# Patient Record
Sex: Female | Born: 2000 | Race: White | Hispanic: No | Marital: Married | Smoking: Never smoker
Health system: Southern US, Community
[De-identification: ages and names within clinical notes are randomized; demographics above are authoritative.]

## PROBLEM LIST (undated history)

## (undated) DIAGNOSIS — E669 Obesity, unspecified: Secondary | ICD-10-CM

## (undated) DIAGNOSIS — F32A Depression, unspecified: Secondary | ICD-10-CM

## (undated) DIAGNOSIS — F419 Anxiety disorder, unspecified: Secondary | ICD-10-CM

## (undated) DIAGNOSIS — D649 Anemia, unspecified: Secondary | ICD-10-CM

## (undated) DIAGNOSIS — J45909 Unspecified asthma, uncomplicated: Secondary | ICD-10-CM

## (undated) DIAGNOSIS — K8020 Calculus of gallbladder without cholecystitis without obstruction: Secondary | ICD-10-CM

## (undated) HISTORY — PX: CHOLECYSTECTOMY: SHX55

## (undated) HISTORY — PX: NO PAST SURGERIES: SHX2092

## (undated) HISTORY — DX: Anemia, unspecified: D64.9

---

## 2021-03-05 ENCOUNTER — Other Ambulatory Visit: Payer: Self-pay

## 2021-03-05 ENCOUNTER — Encounter: Payer: Self-pay | Admitting: Nurse Practitioner

## 2021-03-05 ENCOUNTER — Ambulatory Visit (INDEPENDENT_AMBULATORY_CARE_PROVIDER_SITE_OTHER): Payer: Medicaid Other | Admitting: Nurse Practitioner

## 2021-03-05 VITALS — BP 131/68 | HR 87 | Temp 98.7°F | Ht 64.0 in | Wt 197.2 lb

## 2021-03-05 DIAGNOSIS — Z7689 Persons encountering health services in other specified circumstances: Secondary | ICD-10-CM | POA: Diagnosis not present

## 2021-03-05 DIAGNOSIS — Z3A38 38 weeks gestation of pregnancy: Secondary | ICD-10-CM | POA: Diagnosis not present

## 2021-03-05 NOTE — Progress Notes (Signed)
BP 131/68 (BP Location: Right Arm, Cuff Size: Normal)    Pulse 87    Temp 98.7 F (37.1 C) (Oral)    Ht 5\' 4"  (1.626 m)    Wt 197 lb 3.2 oz (89.4 kg)    SpO2 98%    BMI 33.85 kg/m    Subjective:    Patient ID: Stacey Mccarty, female    DOB: 2000-08-22, 21 y.o.   MRN: [id number]  HPI: Stacey Mccarty is a 21 y.o. female  Chief Complaint  Patient presents with   New Patient (Initial Visit)    Patient states she would like to discuss possible PT for her back. She states that she had issues with her lower back after her first pregnancy.    Patient presents to clinic to establish care with new PCP.  Introduced to Designer, jewellery role and practice setting.  All questions answered.  Discussed provider/patient relationship and expectations.  Patient reports a history of Depression and Anxiety.  Patient denies a history of: Hypertension, Elevated Cholesterol, Diabetes, Thyroid problems, Neurological problems, and Abdominal problems.    Patient is pregnant and due in with her second child in about 2 weeks. Patient states she changed insurances and her previous OB would no longer see her.  She plans to deliver and [facility].  She has had some anemia during her pregnancy.  At this point she has tried to get into other Obs but it hasn't been successful.  She plans to just show up when she is in labor.    Denies HA, CP, SOB, dizziness, palpitations, visual changes, and lower extremity swelling.   Active Ambulatory Problems    Diagnosis Date Noted   No Active Ambulatory Problems   Resolved Ambulatory Problems    Diagnosis Date Noted   No Resolved Ambulatory Problems   Past Medical History:  Diagnosis Date   Anemia    History reviewed. No pertinent surgical history. Family History  Problem Relation Age of Onset   Crohn's disease Mother    Schizophrenia Father    Epilepsy Sister    Autism Sister    Autism Maternal Grandfather      Review of Systems  Eyes:  Negative for  visual disturbance.  Respiratory:  Negative for cough, chest tightness and shortness of breath.   Cardiovascular:  Negative for chest pain, palpitations and leg swelling.  Neurological:  Negative for dizziness and headaches.   Per HPI unless specifically indicated above     Objective:    BP 131/68 (BP Location: Right Arm, Cuff Size: Normal)    Pulse 87    Temp 98.7 F (37.1 C) (Oral)    Ht 5\' 4"  (1.626 m)    Wt 197 lb 3.2 oz (89.4 kg)    SpO2 98%    BMI 33.85 kg/m   Wt Readings from Last 3 Encounters:  03/05/21 197 lb 3.2 oz (89.4 kg)    Physical Exam Vitals and nursing note reviewed.  Constitutional:      General: She is not in acute distress.    Appearance: Normal appearance. She is normal weight. She is not ill-appearing, toxic-appearing or diaphoretic.  HENT:     Head: Normocephalic.     Right Ear: External ear normal.     Left Ear: External ear normal.     Nose: Nose normal.     Mouth/Throat:     Mouth: Mucous membranes are moist.     Pharynx: Oropharynx is clear.  Eyes:  General:        Right eye: No discharge.        Left eye: No discharge.     Extraocular Movements: Extraocular movements intact.     Conjunctiva/sclera: Conjunctivae normal.     Pupils: Pupils are equal, round, and reactive to light.  Cardiovascular:     Rate and Rhythm: Normal rate and regular rhythm.     Heart sounds: No murmur heard. Pulmonary:     Effort: Pulmonary effort is normal. No respiratory distress.     Breath sounds: Normal breath sounds. No wheezing or rales.  Musculoskeletal:     Cervical back: Normal range of motion and neck supple.  Skin:    General: Skin is warm and dry.     Capillary Refill: Capillary refill takes less than 2 seconds.  Neurological:     General: No focal deficit present.     Mental Status: She is alert and oriented to person, place, and time. Mental status is at baseline.  Psychiatric:        Mood and Affect: Mood normal.        Behavior: Behavior  normal.        Thought Content: Thought content normal.        Judgment: Judgment normal.    No results found for this or any previous visit.    Assessment & Plan:   Problem List Items Addressed This Visit   None Visit Diagnoses     [redacted] weeks gestation of pregnancy    -  Primary   Urgent referral placed for patient to try and establish with a new OB. Discussed that if she does go into labor she should go to [facility] for delivery   Relevant Orders   Ambulatory referral to Obstetrics / Gynecology   Encounter to establish care       Follow up in 3 months for physical and fasting labs.        Follow up plan: Return in about 3 months (around 06/02/2021) for Physical and Fasting labs.

## 2021-03-06 ENCOUNTER — Telehealth: Payer: Self-pay | Admitting: Nurse Practitioner

## 2021-03-06 NOTE — Telephone Encounter (Signed)
[facility] states the referral to them states the pt plans to deliver at [facility].  She said their providers only deliver at [facility]. ?

## 2021-03-06 NOTE — Telephone Encounter (Signed)
Copied from CRM [id number]. Topic: General - Other ?>> Mar 06, 2021  3:17 PM Jaquita Rector A wrote: ?Reason for CRM: Patient called in to ask Larae Grooms if there are any OBGYN she can be referred to that is associated with [facility]. Say the one for [facility] does not work for her. Asking for a call back please at Ph# [phone] ?

## 2021-03-06 NOTE — Telephone Encounter (Signed)
Patient was notified that a referral was placed and our referral coordinator Brayton Caves would be reaching out to her. Patient verbalized understanding and has no further questions.  ?

## 2021-03-28 ENCOUNTER — Telehealth: Payer: Self-pay | Admitting: Family Medicine

## 2021-03-28 NOTE — Telephone Encounter (Signed)
Pt. States that she would like to schedule an appointment for a two week postpartum. She isn't one of our patients. She states that the clinic she was going to stopped taking her insurance, but she was a high risk pt. She delivered on 03/23/2021. Please call.  ?

## 2021-03-29 NOTE — Telephone Encounter (Signed)
Phone call to pt. Pt desires to come to [facility] for postpartum, had baby 03/23/21.  Pt states she had some problems with anemia and high blood pressure during pregnancy and both are resolved.  ?Pt prefers Monday appt. 05/06/21 schedule not open at this time.  Pt states she will call in April for 05/06/21 appt. ?

## 2021-03-29 NOTE — Telephone Encounter (Signed)
Call to client to discuss scheduling her a post-partum appt and connection lost. Repeat call to client and left message requesting she return call regarding scheduling her appt Number to call provided. Rich Number, RN ? ?

## 2021-05-02 ENCOUNTER — Ambulatory Visit
Admission: RE | Admit: 2021-05-02 | Discharge: 2021-05-02 | Disposition: A | Discharge status: Home / Self Care | Payer: Medicaid Other | Source: Ambulatory Visit | Attending: Internal Medicine | Admitting: Internal Medicine

## 2021-05-02 ENCOUNTER — Encounter: Payer: Self-pay | Admitting: Internal Medicine

## 2021-05-02 ENCOUNTER — Ambulatory Visit: Payer: Medicaid Other | Admitting: Internal Medicine

## 2021-05-02 ENCOUNTER — Ambulatory Visit
Admission: RE | Admit: 2021-05-02 | Discharge: 2021-05-02 | Disposition: A | Discharge status: Home / Self Care | Payer: Medicaid Other | Attending: Internal Medicine | Admitting: Internal Medicine

## 2021-05-02 VITALS — BP 126/73 | HR 82 | Temp 98.7°F | Wt 185.2 lb

## 2021-05-02 DIAGNOSIS — M79672 Pain in left foot: Secondary | ICD-10-CM | POA: Diagnosis not present

## 2021-05-02 MED ORDER — DICLOFENAC SODIUM 1 % EX GEL: 2.0000 g | Freq: Four times a day (QID) | CUTANEOUS | 1 refills | Status: DC

## 2021-05-02 NOTE — Progress Notes (Signed)
FINDINGS: ?There is no evidence of fracture or dislocation. There is no ?evidence of arthropathy or other focal bone abnormality. Soft ?tissues are unremarkable. ?? ?IMPRESSION: ?Negative. ? ?D/w pt she can use hot and cold compresses for her foot pain and resume her ADL's ?To use voltaren gel sec to breastfeeding  ? ?Frutoso Dimare ?

## 2021-05-02 NOTE — Progress Notes (Signed)
? ?BP 126/73   Pulse 82   Temp 98.7 ?F (37.1 ?C) (Oral)   Wt 185 lb 3.2 oz (84 kg)   LMP  (LMP Unknown) Comment: recent birth  SpO2 98%   Breastfeeding Unknown   BMI 31.79 kg/m?   ? ?Subjective:  ? ? Patient ID: Stacey Mccarty, female    DOB: [birth year], 20 y.o.   MRN: [id number] ? ?Chief Complaint  ?Patient presents with  ?? Toe Pain  ?  Pt states she has been having L big toe pain for the past week. States the pain is worsening. States she fell down the stairs recently and also had a chair turn over and hit the toe as well.   ?? Referral  ?  Pt states she need a referral to a psychologist to have a psych eval for her new job   ? ? ?HPI: ?Stacey Mccarty is a 21 y.o. female ? ?Toe Pain  ?The incident occurred 5 to 7 days ago Larey Seat off the stairs on monday and then on tuesday has had a chair fall on this, she went to the zoo @ [place]). The incident occurred at home. The pain is present in the left foot. The quality of the pain is described as stabbing. The pain is at a severity of 6/10. The pain is moderate. The pain has been Improving since onset. Associated symptoms include an inability to bear weight. Pertinent negatives include no loss of motion, loss of sensation, muscle weakness, numbness or tingling.  ? ? ?Chief Complaint  ?Patient presents with  ?? Toe Pain  ?  Pt states she has been having L big toe pain for the past week. States the pain is worsening. States she fell down the stairs recently and also had a chair turn over and hit the toe as well.   ?? Referral  ?  Pt states she need a referral to a psychologist to have a psych eval for her new job   ? ? ?Relevant past medical, surgical, family and social history reviewed and updated as indicated. Interim medical history since our last visit reviewed. ?Allergies and medications reviewed and updated. ? ?Review of Systems  ?Neurological:  Negative for tingling and numbness.  ? ?Per HPI unless specifically indicated above ? ?   ?Objective:  ?  ?BP 126/73    Pulse 82   Temp 98.7 ?F (37.1 ?C) (Oral)   Wt 185 lb 3.2 oz (84 kg)   LMP  (LMP Unknown) Comment: recent birth  SpO2 98%   Breastfeeding Unknown   BMI 31.79 kg/m?   ?Wt Readings from Last 3 Encounters:  ?05/02/21 185 lb 3.2 oz (84 kg)  ?03/05/21 197 lb 3.2 oz (89.4 kg)  ?  ?Physical Exam ?Vitals and nursing note reviewed.  ?Constitutional:   ?   General: She is not in acute distress. ?   Appearance: Normal appearance.  ?Eyes:  ?   Conjunctiva/sclera: Conjunctivae normal.  ?Pulmonary:  ?   Breath sounds: No rhonchi.  ?Abdominal:  ?   General: There is no distension.  ?   Palpations: There is no mass.  ?Musculoskeletal:     ?   General: Tenderness present. No swelling, deformity or signs of injury.  ?   Right lower leg: No edema.  ?   Left lower leg: No edema.  ?   Comments: Tenderness to touch on the base of the big toe. Pt is 5 weeks post partum with her second baby.   ?Skin: ?  General: Skin is warm and dry.  ?   Coloration: Skin is not jaundiced.  ?   Findings: No erythema.  ?Neurological:  ?   Mental Status: She is alert.  ? ?No results found for this or any previous visit. ?   ? ? ?Current Outpatient Medications:  ??  b complex vitamins capsule, Take 1 capsule by mouth daily., Disp: , Rfl:  ??  Ferrous Sulfate (IRON PO), Take 65 mg by mouth daily., Disp: , Rfl:  ??  Prenatal Vit-Fe Fumarate-FA (PRENATAL MULTIVITAMIN) TABS tablet, Take 1 tablet by mouth daily at 12 noon., Disp: , Rfl:   ? ? ?Assessment & Plan:  ?Foot pain ? Sec to inflammation from recent fall check xray of the foot.  ?- will need to start pt on voltaren gel. ? ?Problem List Items Addressed This Visit   ?None ?  ? ?No orders of the defined types were placed in this encounter. ?  ? ?No orders of the defined types were placed in this encounter. ?  ? ?Follow up plan: ?No follow-ups on file. ? ? ? ?

## 2021-05-07 ENCOUNTER — Telehealth: Payer: Self-pay | Admitting: Nurse Practitioner

## 2021-05-07 NOTE — Telephone Encounter (Signed)
LVM asking pt to call back to schedule an appointment to discuss referral ?

## 2021-05-07 NOTE — Telephone Encounter (Signed)
I can place the referral but I need to know what she needs it for.  I am also happy to see her to start the workup and see if I can help her.  ?

## 2021-05-07 NOTE — Telephone Encounter (Signed)
Copied from CRM [id number]. Topic: Referral - Request for Referral ?>> May 06, 2021  4:49 PM Aretta Nip wrote: ?Has patient seen PCP for this complaint? No. ?*If NO, is insurance requiring patient see PCP for this issue before PCP can refer them? ?Referral for which specialty: Psychiatry ?Preferred provider/office: Anyone that accepts Medicaid  ?Reason for referral: Pt states she had told the nurse triaging that she needed referral at last visit.  [phone] ?

## 2021-05-07 NOTE — Telephone Encounter (Signed)
Pt scheduled for an appt tomorrow

## 2021-05-08 ENCOUNTER — Encounter: Payer: Self-pay | Admitting: Nurse Practitioner

## 2021-05-08 ENCOUNTER — Ambulatory Visit: Payer: Medicaid Other | Admitting: Nurse Practitioner

## 2021-05-08 VITALS — BP 121/73 | HR 99 | Temp 98.5°F | Ht 64.0 in | Wt 184.4 lb

## 2021-05-08 DIAGNOSIS — F339 Major depressive disorder, recurrent, unspecified: Secondary | ICD-10-CM | POA: Diagnosis not present

## 2021-05-08 DIAGNOSIS — F419 Anxiety disorder, unspecified: Secondary | ICD-10-CM | POA: Insufficient documentation

## 2021-05-08 MED ORDER — ESCITALOPRAM OXALATE 5 MG PO TABS: 5.0000 mg | ORAL_TABLET | Freq: Every day | ORAL | 0 refills | Status: DC

## 2021-05-08 NOTE — Assessment & Plan Note (Signed)
Chronic. Not well controlled.  Has suffered with Depression and anxiety for many years but feels like they are exacerbated due to changes in her life including her job and with a new baby.  Will start Lexapro 5mg  daily.  Side effects and benefits discussed during visit.  Will refer to psychiatry.  Also feel like patient would benefit from therapy.  Follow up in 2 weeks for reevaluation.  Call sooner if concerns arise.  ?

## 2021-05-08 NOTE — Progress Notes (Signed)
? ?BP 121/73   Pulse 99   Temp 98.5 ?F (36.9 ?C) (Oral)   Ht 5\' 4"  (1.626 m)   Wt 184 lb 6.4 oz (83.6 kg)   LMP  (LMP Unknown) Comment: recent birth  SpO2 98%   Breastfeeding Yes   BMI 31.65 kg/m?   ? ?Subjective:  ? ? Patient ID: Stacey Mccarty, female    DOB: 2000/10/17, 20 y.o.   MRN: [id number] ? ?HPI: ?Stacey Mccarty is a 21 y.o. female ? ?Chief Complaint  ?Patient presents with  ? Referral  ?  Would like referral to psychologist/psychiatrist   ? ?DEPRESSION/ANXIETY ?Patient states she has always struggled with depression and anxiety.  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  She has realized that she does not feel comfortable going out unless she has her dog with her.  She has a lot of separation anxiety when she is not with her dog.  She also recently had a baby. Feels like her dog retiring and having a baby has caused her hormones to be abnormal.  She is having a lot of trouble just taking her children outside because she is afraid of some sort of trauma that could be experienced.  She has been on medication in the past but can't remember the medications.  She does not feel like she will harm herself but she does have intrusive thoughts that something awful might happen.  Does not plan to harm herself.  ? ?Flowsheet Row Office Visit from 05/08/2021 in [facility]  ?PHQ-9 Total Score 21  ? ?  ? ? ? ?  05/08/2021  ?  2:13 PM 05/02/2021  ?  1:33 PM  ?GAD 7 : Generalized Anxiety Score  ?Nervous, Anxious, on Edge 3 3  ?Control/stop worrying 3 3  ?Worry too much - different things 3 3  ?Trouble relaxing 2 3  ?Restless 0 3  ?Easily annoyed or irritable 3 3  ?Afraid - awful might happen 3 3  ?Total GAD 7 Score 17 21  ?Anxiety Difficulty Very difficult Somewhat difficult  ? ? ? ?Relevant past medical, surgical, family and social history reviewed and updated as indicated. Interim medical history since our last visit reviewed. ?Allergies and medications reviewed and  updated. ? ?Review of Systems  ?Psychiatric/Behavioral:  Positive for dysphoric mood. Negative for suicidal ideas. The patient is nervous/anxious.   ? ?Per HPI unless specifically indicated above ? ?   ?Objective:  ?  ?BP 121/73   Pulse 99   Temp 98.5 ?F (36.9 ?C) (Oral)   Ht 5\' 4"  (1.626 m)   Wt 184 lb 6.4 oz (83.6 kg)   LMP  (LMP Unknown) Comment: recent birth  SpO2 98%   Breastfeeding Yes   BMI 31.65 kg/m?   ?Wt Readings from Last 3 Encounters:  ?05/08/21 184 lb 6.4 oz (83.6 kg)  ?05/02/21 185 lb 3.2 oz (84 kg)  ?03/05/21 197 lb 3.2 oz (89.4 kg)  ?  ?Physical Exam ?Vitals and nursing note reviewed.  ?Constitutional:   ?   General: She is not in acute distress. ?   Appearance: Normal appearance. She is normal weight. She is not ill-appearing, toxic-appearing or diaphoretic.  ?HENT:  ?   Head: Normocephalic.  ?   Right Ear: External ear normal.  ?   Left Ear: External ear normal.  ?   Nose: Nose normal.  ?   Mouth/Throat:  ?   Mouth: Mucous membranes are moist.  ?  Pharynx: Oropharynx is clear.  ?Eyes:  ?   General:     ?   Right eye: No discharge.     ?   Left eye: No discharge.  ?   Extraocular Movements: Extraocular movements intact.  ?   Conjunctiva/sclera: Conjunctivae normal.  ?   Pupils: Pupils are equal, round, and reactive to light.  ?Cardiovascular:  ?   Rate and Rhythm: Normal rate and regular rhythm.  ?   Heart sounds: No murmur heard. ?Pulmonary:  ?   Effort: Pulmonary effort is normal. No respiratory distress.  ?   Breath sounds: Normal breath sounds. No wheezing or rales.  ?Musculoskeletal:  ?   Cervical back: Normal range of motion and neck supple.  ?Skin: ?   General: Skin is warm and dry.  ?   Capillary Refill: Capillary refill takes less than 2 seconds.  ?Neurological:  ?   General: No focal deficit present.  ?   Mental Status: She is alert and oriented to person, place, and time. Mental status is at baseline.  ?Psychiatric:     ?   Mood and Affect: Mood normal.     ?   Behavior:  Behavior normal.     ?   Thought Content: Thought content normal.     ?   Judgment: Judgment normal.  ? ? ?No results found for this or any previous visit. ?   ?Assessment & Plan:  ? ?Problem List Items Addressed This Visit   ? ?  ? Other  ? Depression, recurrent (HCC) - Primary  ?  Chronic. Not well controlled.  Has suffered with Depression and anxiety for many years but feels like they are exacerbated due to changes in her life including her job and with a new baby.  Will start Lexapro 5mg  daily.  Side effects and benefits discussed during visit.  Will refer to psychiatry.  Also feel like patient would benefit from therapy.  Follow up in 2 weeks for reevaluation.  Call sooner if concerns arise.  ? ?  ?  ? Relevant Medications  ? escitalopram (LEXAPRO) 5 MG tablet  ? Other Relevant Orders  ? Ambulatory referral to Psychiatry  ? Anxiety  ?  Chronic. Not well controlled.  Has suffered with Depression and anxiety for many years but feels like they are exacerbated due to changes in her life including her job and with a new baby.  Will start Lexapro 5mg  daily.  Side effects and benefits discussed during visit.  Will refer to psychiatry.  Also feel like patient would benefit from therapy.  Follow up in 2 weeks for reevaluation.  Call sooner if concerns arise.  ? ?  ?  ? Relevant Medications  ? escitalopram (LEXAPRO) 5 MG tablet  ? Other Relevant Orders  ? Ambulatory referral to Psychiatry  ?  ? ?Follow up plan: ?Return in about 2 weeks (around 05/22/2021) for Depression/Anxiety FU. ? ? ? ? ? ?

## 2021-05-08 NOTE — Assessment & Plan Note (Signed)
Chronic. Not well controlled.  Has suffered with Depression and anxiety for many years but feels like they are exacerbated due to changes in her life including her job and with a new baby.  Will start Lexapro 5mg daily.  Side effects and benefits discussed during visit.  Will refer to psychiatry.  Also feel like patient would benefit from therapy.  Follow up in 2 weeks for reevaluation.  Call sooner if concerns arise.  ?

## 2021-05-21 NOTE — Progress Notes (Signed)
? ?BP 128/70   Pulse 97   Temp 98.9 ?F (37.2 ?C) (Oral)   Wt 181 lb 9.6 oz (82.4 kg)   LMP 05/09/2021 (Exact Date) Comment: recent birth  SpO2 98%   Breastfeeding Yes   BMI 31.17 kg/m?   ? ?Subjective:  ? ? Patient ID: Stacey Mccarty, female    DOB: Mar 17, 2000, 20 y.o.   MRN: [id number] ? ?HPI: ?Stacey Mccarty is a 21 y.o. female ? ?Chief Complaint  ?Patient presents with  ? Anxiety  ?  2 week follow up , noticed improvement with intrusive thoughts. States she has had increased irritability with lexapro- unsure if this is a side affect. Would like to speak about that   ? Depression  ? ?DEPRESSION/ANXIETY ?Patient states her depression isn't much better but her anxiety has been a lot better.  She has not been as on edge and anxious lately.  Her separation anxiety has improved.  But she does feel like she is more irritable and short tempered.  She feels like the medication has had a positive impact for her.  ? ?She does not feel like she will harm herself but she does have intrusive thoughts that something awful might happen.  Does not plan to harm herself.  ? ?Flowsheet Row Office Visit from 05/22/2021 in [facility]  ?PHQ-9 Total Score 16  ? ?  ? ? ? ?  05/22/2021  ? 11:13 AM 05/08/2021  ?  2:13 PM 05/02/2021  ?  1:33 PM  ?GAD 7 : Generalized Anxiety Score  ?Nervous, Anxious, on Edge 2 3 3   ?Control/stop worrying 1 3 3   ?Worry too much - different things 2 3 3   ?Trouble relaxing 2 2 3   ?Restless 2 0 3  ?Easily annoyed or irritable 3 3 3   ?Afraid - awful might happen 1 3 3   ?Total GAD 7 Score 13 17 21   ?Anxiety Difficulty Very difficult Very difficult Somewhat difficult  ? ? ? ?Relevant past medical, surgical, family and social history reviewed and updated as indicated. Interim medical history since our last visit reviewed. ?Allergies and medications reviewed and updated. ? ?Review of Systems  ?Psychiatric/Behavioral:  Positive for dysphoric mood. Negative for suicidal ideas. The patient is  nervous/anxious.   ? ?Per HPI unless specifically indicated above ? ?   ?Objective:  ?  ?BP 128/70   Pulse 97   Temp 98.9 ?F (37.2 ?C) (Oral)   Wt 181 lb 9.6 oz (82.4 kg)   LMP 05/09/2021 (Exact Date) Comment: recent birth  SpO2 98%   Breastfeeding Yes   BMI 31.17 kg/m?   ?Wt Readings from Last 3 Encounters:  ?05/22/21 181 lb 9.6 oz (82.4 kg)  ?05/08/21 184 lb 6.4 oz (83.6 kg)  ?05/02/21 185 lb 3.2 oz (84 kg)  ?  ?Physical Exam ?Vitals and nursing note reviewed.  ?Constitutional:   ?   General: She is not in acute distress. ?   Appearance: Normal appearance. She is normal weight. She is not ill-appearing, toxic-appearing or diaphoretic.  ?HENT:  ?   Head: Normocephalic.  ?   Right Ear: External ear normal.  ?   Left Ear: External ear normal.  ?   Nose: Nose normal.  ?   Mouth/Throat:  ?   Mouth: Mucous membranes are moist.  ?   Pharynx: Oropharynx is clear.  ?Eyes:  ?   General:     ?   Right eye: No discharge.     ?   Left  eye: No discharge.  ?   Extraocular Movements: Extraocular movements intact.  ?   Conjunctiva/sclera: Conjunctivae normal.  ?   Pupils: Pupils are equal, round, and reactive to light.  ?Cardiovascular:  ?   Rate and Rhythm: Normal rate and regular rhythm.  ?   Heart sounds: No murmur heard. ?Pulmonary:  ?   Effort: Pulmonary effort is normal. No respiratory distress.  ?   Breath sounds: Normal breath sounds. No wheezing or rales.  ?Musculoskeletal:  ?   Cervical back: Normal range of motion and neck supple.  ?Skin: ?   General: Skin is warm and dry.  ?   Capillary Refill: Capillary refill takes less than 2 seconds.  ?Neurological:  ?   General: No focal deficit present.  ?   Mental Status: She is alert and oriented to person, place, and time. Mental status is at baseline.  ?Psychiatric:     ?   Mood and Affect: Mood normal.     ?   Behavior: Behavior normal.     ?   Thought Content: Thought content normal.     ?   Judgment: Judgment normal.  ? ? ?No results found for this or any previous  visit. ?   ?Assessment & Plan:  ? ?Problem List Items Addressed This Visit   ? ?  ? Other  ? Depression, recurrent (HCC) - Primary  ?  Chronic.  Improved.  Depression hasn't improved but anxiety is improved.  Would like to increase Lexapro to 10mg .  If irritability worsens or does not improve with Lexapro 10mg  will change to a different medication such as celexa.  Return to clinic in 1 months for reevaluation.  Call sooner if concerns arise.  ? ? ?  ?  ? Relevant Medications  ? escitalopram (LEXAPRO) 10 MG tablet  ? Anxiety  ? Relevant Medications  ? escitalopram (LEXAPRO) 10 MG tablet  ?  ? ?Follow up plan: ?Return in about 1 month (around 06/22/2021) for Depression/Anxiety FU. ? ? ? ? ? ? ? ?

## 2021-05-22 ENCOUNTER — Ambulatory Visit: Payer: Medicaid Other | Admitting: Nurse Practitioner

## 2021-05-22 ENCOUNTER — Encounter: Payer: Self-pay | Admitting: Nurse Practitioner

## 2021-05-22 VITALS — BP 128/70 | HR 97 | Temp 98.9°F | Wt 181.6 lb

## 2021-05-22 DIAGNOSIS — F339 Major depressive disorder, recurrent, unspecified: Secondary | ICD-10-CM | POA: Diagnosis not present

## 2021-05-22 DIAGNOSIS — F419 Anxiety disorder, unspecified: Secondary | ICD-10-CM | POA: Diagnosis not present

## 2021-05-22 MED ORDER — ESCITALOPRAM OXALATE 10 MG PO TABS: 10.0000 mg | ORAL_TABLET | Freq: Every day | ORAL | 0 refills | Status: DC

## 2021-05-22 NOTE — Assessment & Plan Note (Signed)
Chronic.  Improved.  Depression hasn't improved but anxiety is improved.  Would like to increase Lexapro to 10mg .  If irritability worsens or does not improve with Lexapro 10mg  will change to a different medication such as celexa.  Return to clinic in 1 months for reevaluation.  Call sooner if concerns arise.  ? ?

## 2021-06-07 NOTE — Progress Notes (Deleted)
There were no vitals taken for this visit.   Subjective:    Patient ID: Stacey Mccarty, female    DOB: [birth year], 21 y.o.   MRN: [id number]  HPI: Stacey Mccarty is a 21 y.o. female presenting on 06/10/2021 for comprehensive medical examination. Current medical complaints include:{Blank single:19197::"none","***"}  She currently lives with: Menopausal Symptoms: {Blank single:19197::"yes","no"}  MOOD  Depression Screen done today and results listed below:     05/22/2021   11:12 AM 05/08/2021    2:11 PM 05/02/2021    1:33 PM  Depression screen PHQ 2/9  Decreased Interest 1 1 2   Down, Depressed, Hopeless 2 3 3   PHQ - 2 Score 3 4 5   Altered sleeping 3 3 3   Tired, decreased energy 1 3 3   Change in appetite 3 3 3   Feeling bad or failure about yourself  1 3 1   Trouble concentrating 2 1 1   Moving slowly or fidgety/restless 2 2 3   Suicidal thoughts 1 2 0  PHQ-9 Score 16 21 19   Difficult doing work/chores Very difficult Very difficult Somewhat difficult    The patient {has/does not have:19849} a history of falls. I {did/did not:19850} complete a risk assessment for falls. A plan of care for falls {was/was not:19852} documented.   Past Medical History:  Past Medical History:  Diagnosis Date   Anemia     Surgical History:  No past surgical history on file.  Medications:  Current Outpatient Medications on File Prior to Visit  Medication Sig   b complex vitamins capsule Take 1 capsule by mouth daily.   escitalopram (LEXAPRO) 10 MG tablet Take 1 tablet (10 mg total) by mouth daily.   Ferrous Sulfate (IRON PO) Take 65 mg by mouth daily.   Prenatal Vit-Fe Fumarate-FA (PRENATAL MULTIVITAMIN) TABS tablet Take 1 tablet by mouth daily at 12 noon.   VITAMIN D, CHOLECALCIFEROL, PO Take by mouth.   No current facility-administered medications on file prior to visit.    Allergies:  Allergies  Allergen Reactions   Iodine Nausea And Vomiting   Latex Rash    Social History:  Social  History   Socioeconomic History   Marital status: Married    Spouse name: Not on file   Number of children: Not on file   Years of education: Not on file   Highest education level: Not on file  Occupational History   Not on file  Tobacco Use   Smoking status: Never   Smokeless tobacco: Never  Vaping Use   Vaping Use: Never used  Substance and Sexual Activity   Alcohol use: Never   Drug use: Never   Sexual activity: Yes  Other Topics Concern   Not on file  Social History Narrative   Not on file   Social Determinants of Health   Financial Resource Strain: Not on file  Food Insecurity: Not on file  Transportation Needs: Not on file  Physical Activity: Not on file  Stress: Not on file  Social Connections: Not on file  Intimate Partner Violence: Not on file   Social History   Tobacco Use  Smoking Status Never  Smokeless Tobacco Never   Social History   Substance and Sexual Activity  Alcohol Use Never    Family History:  Family History  Problem Relation Age of Onset   Crohn's disease Mother    Schizophrenia Father    Epilepsy Sister    Autism Sister    Autism Maternal Grandfather     Past medical history,  surgical history, medications, allergies, family history and social history reviewed with patient today and changes made to appropriate areas of the chart.   ROS All other ROS negative except what is listed above and in the HPI.      Objective:    There were no vitals taken for this visit.  Wt Readings from Last 3 Encounters:  05/22/21 181 lb 9.6 oz (82.4 kg)  05/08/21 184 lb 6.4 oz (83.6 kg)  05/02/21 185 lb 3.2 oz (84 kg)    Physical Exam  No results found for this or any previous visit.    Assessment & Plan:   Problem List Items Addressed This Visit       Other   Depression, recurrent (HCC) - Primary   Anxiety     Follow up plan: No follow-ups on file.   LABORATORY TESTING:  - Pap smear: {Blank single:19197::"pap done","not  applicable","up to date","done elsewhere"}  IMMUNIZATIONS:   - Tdap: Tetanus vaccination status reviewed: {tetanus status:315746}. - Influenza: {Blank single:19197::"Up to date","Administered today","Postponed to flu season","Refused","Given elsewhere"} - Pneumovax: {Blank single:19197::"Up to date","Administered today","Not applicable","Refused","Given elsewhere"} - Prevnar: {Blank single:19197::"Up to date","Administered today","Not applicable","Refused","Given elsewhere"} - COVID: {Blank single:19197::"Up to date","Administered today","Not applicable","Refused","Given elsewhere"} - HPV: {Blank single:19197::"Up to date","Administered today","Not applicable","Refused","Given elsewhere"} - Shingrix vaccine: {Blank single:19197::"Up to date","Administered today","Not applicable","Refused","Given elsewhere"}  SCREENING: -Mammogram: {Blank single:19197::"Up to date","Ordered today","Not applicable","Refused","Done elsewhere"}  - Colonoscopy: {Blank single:19197::"Up to date","Ordered today","Not applicable","Refused","Done elsewhere"}  - Bone Density: {Blank single:19197::"Up to date","Ordered today","Not applicable","Refused","Done elsewhere"}  -Hearing Test: {Blank single:19197::"Up to date","Ordered today","Not applicable","Refused","Done elsewhere"}  -Spirometry: {Blank single:19197::"Up to date","Ordered today","Not applicable","Refused","Done elsewhere"}   PATIENT COUNSELING:   Advised to take 1 mg of folate supplement per day if capable of pregnancy.   Sexuality: Discussed sexually transmitted diseases, partner selection, use of condoms, avoidance of unintended pregnancy  and contraceptive alternatives.   Advised to avoid cigarette smoking.  I discussed with the patient that most people either abstain from alcohol or drink within safe limits (<=14/week and <=4 drinks/occasion for males, <=7/weeks and <= 3 drinks/occasion for females) and that the risk for alcohol disorders and other  health effects rises proportionally with the number of drinks per week and how often a drinker exceeds daily limits.  Discussed cessation/primary prevention of drug use and availability of treatment for abuse.   Diet: Encouraged to adjust caloric intake to maintain  or achieve ideal body weight, to reduce intake of dietary saturated fat and total fat, to limit sodium intake by avoiding high sodium foods and not adding table salt, and to maintain adequate dietary potassium and calcium preferably from fresh fruits, vegetables, and low-fat dairy products.    stressed the importance of regular exercise  Injury prevention: Discussed safety belts, safety helmets, smoke detector, smoking near bedding or upholstery.   Dental health: Discussed importance of regular tooth brushing, flossing, and dental visits.    NEXT PREVENTATIVE PHYSICAL DUE IN 1 YEAR. No follow-ups on file.

## 2021-06-10 ENCOUNTER — Encounter: Payer: Medicaid Other | Admitting: Nurse Practitioner

## 2021-06-10 DIAGNOSIS — F339 Major depressive disorder, recurrent, unspecified: Secondary | ICD-10-CM

## 2021-06-10 DIAGNOSIS — F419 Anxiety disorder, unspecified: Secondary | ICD-10-CM

## 2021-06-10 DIAGNOSIS — Z0000 Encounter for general adult medical examination without abnormal findings: Secondary | ICD-10-CM

## 2021-06-10 DIAGNOSIS — Z136 Encounter for screening for cardiovascular disorders: Secondary | ICD-10-CM
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
## 2021-06-19 ENCOUNTER — Ambulatory Visit (INDEPENDENT_AMBULATORY_CARE_PROVIDER_SITE_OTHER): Payer: No Typology Code available for payment source | Admitting: Psychiatry

## 2021-06-19 ENCOUNTER — Encounter: Payer: Self-pay | Admitting: Psychiatry

## 2021-06-19 DIAGNOSIS — F331 Major depressive disorder, recurrent, moderate: Secondary | ICD-10-CM | POA: Diagnosis not present

## 2021-06-19 DIAGNOSIS — F4310 Post-traumatic stress disorder, unspecified: Secondary | ICD-10-CM | POA: Diagnosis not present

## 2021-06-19 DIAGNOSIS — F411 Generalized anxiety disorder: Secondary | ICD-10-CM

## 2021-06-19 MED ORDER — ESCITALOPRAM OXALATE 20 MG PO TABS: 20.0000 mg | ORAL_TABLET | Freq: Every day | ORAL | 1 refills | Status: DC

## 2021-06-19 NOTE — Patient Instructions (Signed)
Increase Lexapro 20 mg daily Next appointment: 7/19 at 3:30, video

## 2021-06-27 ENCOUNTER — Ambulatory Visit: Payer: Medicaid Other | Admitting: Nurse Practitioner

## 2021-07-12 ENCOUNTER — Ambulatory Visit (INDEPENDENT_AMBULATORY_CARE_PROVIDER_SITE_OTHER): Payer: Medicaid Other | Admitting: Nurse Practitioner

## 2021-07-12 ENCOUNTER — Encounter: Payer: Self-pay | Admitting: Nurse Practitioner

## 2021-07-12 ENCOUNTER — Encounter: Payer: Medicaid Other | Admitting: Obstetrics and Gynecology

## 2021-07-12 VITALS — BP 112/74 | HR 57 | Temp 98.4°F | Wt 174.4 lb

## 2021-07-12 DIAGNOSIS — F339 Major depressive disorder, recurrent, unspecified: Secondary | ICD-10-CM

## 2021-07-12 DIAGNOSIS — F419 Anxiety disorder, unspecified: Secondary | ICD-10-CM | POA: Diagnosis not present

## 2021-07-12 DIAGNOSIS — Z3009 Encounter for other general counseling and advice on contraception: Secondary | ICD-10-CM | POA: Diagnosis not present

## 2021-07-12 MED ORDER — NORETHINDRONE 0.35 MG PO TABS: 1.0000 | ORAL_TABLET | Freq: Every day | ORAL | 0 refills | Status: DC

## 2021-07-12 MED ORDER — ESCITALOPRAM OXALATE 10 MG PO TABS: 10.0000 mg | ORAL_TABLET | Freq: Every day | ORAL | 0 refills | Status: DC

## 2021-07-12 NOTE — Progress Notes (Signed)
BP 112/74   Pulse (!) 57   Temp 98.4 F (36.9 C) (Oral)   Wt 174 lb 6.4 oz (79.1 kg)   SpO2 98%   Breastfeeding Yes   BMI 29.94 kg/m    Subjective:    Patient ID: Stacey Mccarty, female    DOB: 04/02/2000, 20 y.o.   MRN: [id number]  HPI: Stacey Mccarty is a 21 y.o. female  Chief Complaint  Patient presents with   Contraception    Would like to get on birth control and referral to OBGYN    Patient states she would like to discuss birth control.  She has been trouble getting into GYN.  She was supposed to have an appointment yesterday but the office called to cancel and told her it would be till November before she can get in.  DEPRESSION/ANXIETY Patient states she saw a psychiatrist who increased her Lexapro to 20mg .  States she has been more fatigued and feeling more numb than when she was on the 10mg .  She is sleeping about 14 hours and still sleepy.  Flowsheet Row Office Visit from 07/12/2021 in [facility]  PHQ-9 Total Score 14         07/12/2021    8:15 AM 05/22/2021   11:13 AM 05/08/2021    2:13 PM 05/02/2021    1:33 PM  GAD 7 : Generalized Anxiety Score  Nervous, Anxious, on Edge 1 2 3 3   Control/stop worrying 1 1 3 3   Worry too much - different things 1 2 3 3   Trouble relaxing 3 2 2 3   Restless 3 2 0 3  Easily annoyed or irritable 3 3 3 3   Afraid - awful might happen 1 1 3 3   Total GAD 7 Score 13 13 17 21   Anxiety Difficulty Not difficult at all Very difficult Very difficult Somewhat difficult       Relevant past medical, surgical, family and social history reviewed and updated as indicated. Interim medical history since our last visit reviewed. Allergies and medications reviewed and updated.  Review of Systems  Psychiatric/Behavioral:  Positive for dysphoric mood. Negative for sleep disturbance. The patient is nervous/anxious.     Per HPI unless specifically indicated above     Objective:    BP 112/74   Pulse (!) 57   Temp 98.4 F (36.9  C) (Oral)   Wt 174 lb 6.4 oz (79.1 kg)   SpO2 98%   Breastfeeding Yes   BMI 29.94 kg/m   Wt Readings from Last 3 Encounters:  07/12/21 174 lb 6.4 oz (79.1 kg)  05/22/21 181 lb 9.6 oz (82.4 kg)  05/08/21 184 lb 6.4 oz (83.6 kg)    Physical Exam Vitals and nursing note reviewed.  Constitutional:      General: She is not in acute distress.    Appearance: Normal appearance. She is normal weight. She is not ill-appearing, toxic-appearing or diaphoretic.  HENT:     Head: Normocephalic.     Right Ear: External ear normal.     Left Ear: External ear normal.     Nose: Nose normal.     Mouth/Throat:     Mouth: Mucous membranes are moist.     Pharynx: Oropharynx is clear.  Eyes:     General:        Right eye: No discharge.        Left eye: No discharge.     Extraocular Movements: Extraocular movements intact.     Conjunctiva/sclera: Conjunctivae normal.  Pupils: Pupils are equal, round, and reactive to light.  Cardiovascular:     Rate and Rhythm: Normal rate and regular rhythm.     Heart sounds: No murmur heard. Pulmonary:     Effort: Pulmonary effort is normal. No respiratory distress.     Breath sounds: Normal breath sounds. No wheezing or rales.  Musculoskeletal:     Cervical back: Normal range of motion and neck supple.  Skin:    General: Skin is warm and dry.     Capillary Refill: Capillary refill takes less than 2 seconds.  Neurological:     General: No focal deficit present.     Mental Status: She is alert and oriented to person, place, and time. Mental status is at baseline.  Psychiatric:        Mood and Affect: Mood normal.        Behavior: Behavior normal.        Thought Content: Thought content normal.        Judgment: Judgment normal.     No results found for this or any previous visit.    Assessment & Plan:   Problem List Items Addressed This Visit       Other   Depression, recurrent (HCC) - Primary    Chronic.  Patient has been very sleepy and more  "numb" with the Lexapro 20mg .  Will decrease dose to 10mg .  Will follow up in 1 month for reevaluation.  Call sooner if concerns arise.       Relevant Medications   escitalopram (LEXAPRO) 10 MG tablet   Anxiety    Chronic.  Patient has been very sleepy and more "numb" with the Lexapro 20mg .  Will decrease dose to 10mg .  Will follow up in 1 month for reevaluation.  Call sooner if concerns arise.       Relevant Medications   escitalopram (LEXAPRO) 10 MG tablet   Other Visit Diagnoses     Birth control counseling       Discussed with patient that she needs a back up form of birth control for 2 weeks. Keep appt with GYN to discuss IUD. Need to take pill at the same time daily.        Follow up plan: Return in about 1 month (around 08/12/2021) for Depression/Anxiety FU.

## 2021-07-12 NOTE — Assessment & Plan Note (Signed)
Chronic.  Patient has been very sleepy and more "numb" with the Lexapro 20mg .  Will decrease dose to 10mg .  Will follow up in 1 month for reevaluation.  Call sooner if concerns arise.

## 2021-07-12 NOTE — Assessment & Plan Note (Signed)
Chronic.  Patient has been very sleepy and more "numb" with the Lexapro 20mg.  Will decrease dose to 10mg.  Will follow up in 1 month for reevaluation.  Call sooner if concerns arise.  

## 2021-07-17 ENCOUNTER — Ambulatory Visit: Payer: Medicaid Other | Admitting: Nurse Practitioner
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
## 2021-07-24 ENCOUNTER — Telehealth: Payer: No Typology Code available for payment source | Admitting: Psychiatry

## 2021-07-24 ENCOUNTER — Telehealth: Payer: Self-pay | Admitting: Psychiatry

## 2021-07-24 NOTE — Telephone Encounter (Signed)
Sent link for video visit through Epic. Patient did not sign in. Called the patient for appointment scheduled today. The patient did not answer the phone. Left voice message to contact the office ([phone]).   ?

## 2021-08-12 ENCOUNTER — Encounter: Payer: Self-pay | Admitting: Nurse Practitioner

## 2021-08-12 ENCOUNTER — Ambulatory Visit: Payer: Medicaid Other | Admitting: Nurse Practitioner

## 2021-08-12 VITALS — BP 121/74 | HR 75 | Temp 98.5°F | Wt 181.5 lb

## 2021-08-12 DIAGNOSIS — F419 Anxiety disorder, unspecified: Secondary | ICD-10-CM

## 2021-08-12 DIAGNOSIS — F339 Major depressive disorder, recurrent, unspecified: Secondary | ICD-10-CM | POA: Diagnosis not present

## 2021-08-12 MED ORDER — SERTRALINE HCL 25 MG PO TABS: 25.0000 mg | ORAL_TABLET | Freq: Every day | ORAL | 0 refills | Status: DC

## 2021-08-12 NOTE — Progress Notes (Signed)
BP 121/74   Pulse 75   Temp 98.5 F (36.9 C) (Oral)   Wt 181 lb 8 oz (82.3 kg)   LMP  (LMP Unknown)   SpO2 98%   Breastfeeding Yes   BMI 31.15 kg/m    Subjective:    Patient ID: Stacey Mccarty, female    DOB: 07-20-2000, 21 y.o.   MRN: [id number]  HPI: Stacey Mccarty is a 21 y.o. female  Chief Complaint  Patient presents with   Anxiety   Depression    1 month follow up - patient states she has been waking up in the morning with back pain (entire). Constant pain. OTC tylenol , heat/compressions are not working for her.      DEPRESSION/ANXIETY Patient states things were improving for awhile and then kind of stopped.  She hasn't been having intrusive thoughts but she is still having depression and anxiety.     Flowsheet Row Office Visit from 08/12/2021 in [facility]  PHQ-9 Total Score 14         08/12/2021    2:51 PM 07/12/2021    8:15 AM 05/22/2021   11:13 AM 05/08/2021    2:13 PM  GAD 7 : Generalized Anxiety Score  Nervous, Anxious, on Edge 1 1 2 3   Control/stop worrying 1 1 1 3   Worry too much - different things 1 1 2 3   Trouble relaxing 3 3 2 2   Restless 3 3 2  0  Easily annoyed or irritable 2 3 3 3   Afraid - awful might happen 0 1 1 3   Total GAD 7 Score 11 13 13 17   Anxiety Difficulty Somewhat difficult Not difficult at all Very difficult Very difficult       Relevant past medical, surgical, family and social history reviewed and updated as indicated. Interim medical history since our last visit reviewed. Allergies and medications reviewed and updated.  Review of Systems  Psychiatric/Behavioral:  Positive for dysphoric mood. Negative for sleep disturbance. The patient is nervous/anxious.     Per HPI unless specifically indicated above     Objective:    BP 121/74   Pulse 75   Temp 98.5 F (36.9 C) (Oral)   Wt 181 lb 8 oz (82.3 kg)   LMP  (LMP Unknown)   SpO2 98%   Breastfeeding Yes   BMI 31.15 kg/m   Wt Readings from Last 3 Encounters:   08/12/21 181 lb 8 oz (82.3 kg)  07/12/21 174 lb 6.4 oz (79.1 kg)  05/22/21 181 lb 9.6 oz (82.4 kg)    Physical Exam Vitals and nursing note reviewed.  Constitutional:      General: She is not in acute distress.    Appearance: Normal appearance. She is normal weight. She is not ill-appearing, toxic-appearing or diaphoretic.  HENT:     Head: Normocephalic.     Right Ear: External ear normal.     Left Ear: External ear normal.     Nose: Nose normal.     Mouth/Throat:     Mouth: Mucous membranes are moist.     Pharynx: Oropharynx is clear.  Eyes:     General:        Right eye: No discharge.        Left eye: No discharge.     Extraocular Movements: Extraocular movements intact.     Conjunctiva/sclera: Conjunctivae normal.     Pupils: Pupils are equal, round, and reactive to light.  Cardiovascular:     Rate and Rhythm:  Normal rate and regular rhythm.     Heart sounds: No murmur heard. Pulmonary:     Effort: Pulmonary effort is normal. No respiratory distress.     Breath sounds: Normal breath sounds. No wheezing or rales.  Musculoskeletal:     Cervical back: Normal range of motion and neck supple.  Skin:    General: Skin is warm and dry.     Capillary Refill: Capillary refill takes less than 2 seconds.  Neurological:     General: No focal deficit present.     Mental Status: She is alert and oriented to person, place, and time. Mental status is at baseline.  Psychiatric:        Mood and Affect: Mood normal.        Behavior: Behavior normal.        Thought Content: Thought content normal.        Judgment: Judgment normal.     No results found for this or any previous visit.    Assessment & Plan:   Problem List Items Addressed This Visit       Other   Depression, recurrent (HCC) - Primary    Chronic.  Not well controlled.  Will change from Lexapro to Zoloft 25mg  daily. Side effects and benefits of medication discussed during visit.  Follow up in 1 month for  reevaluation. Call sooner if concerns arise.       Relevant Medications   sertraline (ZOLOFT) 25 MG tablet   Anxiety    Chronic.  Not well controlled.  Will change from Lexapro to Zoloft 25mg  daily. Side effects and benefits of medication discussed during visit.  Follow up in 1 month for reevaluation. Call sooner if concerns arise.       Relevant Medications   sertraline (ZOLOFT) 25 MG tablet     Follow up plan: Return in about 1 month (around 09/12/2021) for Depression/Anxiety FU.

## 2021-08-12 NOTE — Assessment & Plan Note (Signed)
Chronic.  Not well controlled.  Will change from Lexapro to Zoloft 25mg daily. Side effects and benefits of medication discussed during visit.  Follow up in 1 month for reevaluation. Call sooner if concerns arise.  

## 2021-08-12 NOTE — Assessment & Plan Note (Signed)
Chronic.  Not well controlled.  Will change from Lexapro to Zoloft 25mg  daily. Side effects and benefits of medication discussed during visit.  Follow up in 1 month for reevaluation. Call sooner if concerns arise.

## 2021-08-21 ENCOUNTER — Encounter: Payer: Medicaid Other | Admitting: Obstetrics and Gynecology

## 2021-08-21 DIAGNOSIS — Z30430 Encounter for insertion of intrauterine contraceptive device: Secondary | ICD-10-CM

## 2021-08-21 DIAGNOSIS — Z3009 Encounter for other general counseling and advice on contraception: Secondary | ICD-10-CM

## 2021-08-21 DIAGNOSIS — Z7689 Persons encountering health services in other specified circumstances: Secondary | ICD-10-CM

## 2021-09-18 NOTE — Progress Notes (Deleted)
There were no vitals taken for this visit.   Subjective:    Patient ID: Stacey Mccarty, female    DOB: 2000/07/15, 20 y.o.   MRN: [id number]  HPI: Stacey Mccarty is a 21 y.o. female  No chief complaint on file.    DEPRESSION/ANXIETY Patient states things were improving for awhile and then kind of stopped.  She hasn't been having intrusive thoughts but she is still having depression and anxiety.     Flowsheet Row Office Visit from 08/12/2021 in [facility]  PHQ-9 Total Score 14         08/12/2021    2:51 PM 07/12/2021    8:15 AM 05/22/2021   11:13 AM 05/08/2021    2:13 PM  GAD 7 : Generalized Anxiety Score  Nervous, Anxious, on Edge 1 1 2 3   Control/stop worrying 1 1 1 3   Worry too much - different things 1 1 2 3   Trouble relaxing 3 3 2 2   Restless 3 3 2  0  Easily annoyed or irritable 2 3 3 3   Afraid - awful might happen 0 1 1 3   Total GAD 7 Score 11 13 13 17   Anxiety Difficulty Somewhat difficult Not difficult at all Very difficult Very difficult       Relevant past medical, surgical, family and social history reviewed and updated as indicated. Interim medical history since our last visit reviewed. Allergies and medications reviewed and updated.  Review of Systems  Psychiatric/Behavioral:  Positive for dysphoric mood. Negative for sleep disturbance. The patient is nervous/anxious.     Per HPI unless specifically indicated above     Objective:    There were no vitals taken for this visit.  Wt Readings from Last 3 Encounters:  08/12/21 181 lb 8 oz (82.3 kg)  07/12/21 174 lb 6.4 oz (79.1 kg)  05/22/21 181 lb 9.6 oz (82.4 kg)    Physical Exam Vitals and nursing note reviewed.  Constitutional:      General: She is not in acute distress.    Appearance: Normal appearance. She is normal weight. She is not ill-appearing, toxic-appearing or diaphoretic.  HENT:     Head: Normocephalic.     Right Ear: External ear normal.     Left Ear: External ear normal.      Nose: Nose normal.     Mouth/Throat:     Mouth: Mucous membranes are moist.     Pharynx: Oropharynx is clear.  Eyes:     General:        Right eye: No discharge.        Left eye: No discharge.     Extraocular Movements: Extraocular movements intact.     Conjunctiva/sclera: Conjunctivae normal.     Pupils: Pupils are equal, round, and reactive to light.  Cardiovascular:     Rate and Rhythm: Normal rate and regular rhythm.     Heart sounds: No murmur heard. Pulmonary:     Effort: Pulmonary effort is normal. No respiratory distress.     Breath sounds: Normal breath sounds. No wheezing or rales.  Musculoskeletal:     Cervical back: Normal range of motion and neck supple.  Skin:    General: Skin is warm and dry.     Capillary Refill: Capillary refill takes less than 2 seconds.  Neurological:     General: No focal deficit present.     Mental Status: She is alert and oriented to person, place, and time. Mental status is at baseline.  Psychiatric:        Mood and Affect: Mood normal.        Behavior: Behavior normal.        Thought Content: Thought content normal.        Judgment: Judgment normal.    No results found for this or any previous visit.    Assessment & Plan:   Problem List Items Addressed This Visit       Other   Depression, recurrent (HCC) - Primary   Anxiety     Follow up plan: No follow-ups on file.

## 2021-09-19 ENCOUNTER — Ambulatory Visit: Payer: Medicaid Other | Admitting: Nurse Practitioner

## 2021-09-19 DIAGNOSIS — F419 Anxiety disorder, unspecified: Secondary | ICD-10-CM

## 2021-09-19 DIAGNOSIS — F339 Major depressive disorder, recurrent, unspecified: Secondary | ICD-10-CM

## 2021-11-11 ENCOUNTER — Ambulatory Visit: Payer: Self-pay | Admitting: *Deleted

## 2021-11-11 NOTE — Telephone Encounter (Signed)
Summary: rx concern / rx req   The patient has made contact regarding their prescription for sertraline (ZOLOFT) 25 MG tablet [[id number]]  The patient shares that they feel the medication is ineffective  The patient would like to be (re)prescribed an alternative when possible  Please contact further when available       Chief Complaint: requesting medication change and referral to GYN.  Symptoms: feels medication zoloft ineffective. Angers easily and would rather be put back on lexapro. Requesting referral to OBGYN for d/c of birth control pills and get IUD. Frequency: since starting medication 08/12/21 Pertinent Negatives: Patient denies na Disposition: [] ED /[] Urgent Care (no appt availability in office) / [x] Appointment(In office/virtual)/ []  [facility]/ [] Home Care/ [] Refused Recommended Disposition /[] [facility]/ []  Follow-up with PCP Additional Notes:   Appt scheduled for tomorrow.        Reason for Disposition  Prescription request for new medicine (not a refill)  Answer Assessment - Initial Assessment Questions 1. NAME of MEDICINE: "What medicine(s) are you calling about?"     zoloft 2. QUESTION: "What is your question?" (e.g., double dose of medicine, side effect)     Feels zoloft ineffective since taking since 08/12/21. Wants to go back on lexapro. Can patient be referred to OBGYN to d/c birht control pills and get IUD 3. PRESCRIBER: "Who prescribed the medicine?" Reason: if prescribed by specialist, call should be referred to that group.     PCP 4. SYMPTOMS: "Do you have any symptoms?" If Yes, ask: "What symptoms are you having?"  "How bad are the symptoms (e.g., mild, moderate, severe)     Medication ineffective , angers easily 5. PREGNANCY:  "Is there any chance that you are pregnant?" "When was your last menstrual period?"     na  Protocols used: Medication Question Call-A-AH

## 2021-11-12 ENCOUNTER — Encounter: Payer: Self-pay | Admitting: Nurse Practitioner

## 2021-11-12 ENCOUNTER — Ambulatory Visit (INDEPENDENT_AMBULATORY_CARE_PROVIDER_SITE_OTHER): Payer: Medicaid Other | Admitting: Nurse Practitioner

## 2021-11-12 VITALS — BP 119/65 | HR 74 | Temp 98.1°F | Wt 177.1 lb

## 2021-11-12 DIAGNOSIS — F339 Major depressive disorder, recurrent, unspecified: Secondary | ICD-10-CM | POA: Diagnosis not present

## 2021-11-12 DIAGNOSIS — F419 Anxiety disorder, unspecified: Secondary | ICD-10-CM | POA: Diagnosis not present

## 2021-11-12 DIAGNOSIS — Z30014 Encounter for initial prescription of intrauterine contraceptive device: Secondary | ICD-10-CM | POA: Diagnosis not present

## 2021-11-12 MED ORDER — ESCITALOPRAM OXALATE 10 MG PO TABS: 10.0000 mg | ORAL_TABLET | Freq: Every day | ORAL | 0 refills | Status: DC

## 2021-11-12 NOTE — Progress Notes (Signed)
BP 119/65   Pulse 74   Temp 98.1 F (36.7 C) (Oral)   Wt 177 lb 1.6 oz (80.3 kg)   LMP 09/15/2021 (Approximate)   SpO2 98%   Breastfeeding Yes   BMI 30.40 kg/m    Subjective:    Patient ID: Stacey Mccarty, female    DOB: May 06, 2000, 21 y.o.   MRN: [id number]  HPI: Stacey Mccarty is a 21 y.o. female  Chief Complaint  Patient presents with   medication changes    DEPRESSION/ANXIETY Patient states she feels like she would like to get back on Lexapro. She does not feel like the Zoloft is improving her mood.  She feels like her anxiety and mood were much better controlled with Lexapro.  Denies SI.   [place] Office Visit from 08/12/2021 in [facility]  PHQ-9 Total Score 14         08/12/2021    2:51 PM 07/12/2021    8:15 AM 05/22/2021   11:13 AM 05/08/2021    2:13 PM  GAD 7 : Generalized Anxiety Score  Nervous, Anxious, on Edge 1 1 2 3   Control/stop worrying 1 1 1 3   Worry too much - different things 1 1 2 3   Trouble relaxing 3 3 2 2   Restless 3 3 2  0  Easily annoyed or irritable 2 3 3 3   Afraid - awful might happen 0 1 1 3   Total GAD 7 Score 11 13 13 17   Anxiety Difficulty Somewhat difficult Not difficult at all Very difficult Very difficult       Relevant past medical, surgical, family and social history reviewed and updated as indicated. Interim medical history since our last visit reviewed. Allergies and medications reviewed and updated.  Review of Systems  Psychiatric/Behavioral:  Positive for dysphoric mood. Negative for sleep disturbance. The patient is nervous/anxious.     Per HPI unless specifically indicated above     Objective:    BP 119/65   Pulse 74   Temp 98.1 F (36.7 C) (Oral)   Wt 177 lb 1.6 oz (80.3 kg)   LMP 09/15/2021 (Approximate)   SpO2 98%   Breastfeeding Yes   BMI 30.40 kg/m   Wt Readings from Last 3 Encounters:  11/12/21 177 lb 1.6 oz (80.3 kg)  08/12/21 181 lb 8 oz (82.3 kg)  07/12/21 174 lb 6.4 oz (79.1 kg)     Physical Exam Vitals and nursing note reviewed.  Constitutional:      General: She is not in acute distress.    Appearance: Normal appearance. She is normal weight. She is not ill-appearing, toxic-appearing or diaphoretic.  HENT:     Head: Normocephalic.     Right Ear: External ear normal.     Left Ear: External ear normal.     Nose: Nose normal.     Mouth/Throat:     Mouth: Mucous membranes are moist.     Pharynx: Oropharynx is clear.  Eyes:     General:        Right eye: No discharge.        Left eye: No discharge.     Extraocular Movements: Extraocular movements intact.     Conjunctiva/sclera: Conjunctivae normal.     Pupils: Pupils are equal, round, and reactive to light.  Cardiovascular:     Rate and Rhythm: Normal rate and regular rhythm.     Heart sounds: No murmur heard. Pulmonary:     Effort: Pulmonary effort is normal. No respiratory distress.  Breath sounds: Normal breath sounds. No wheezing or rales.  Musculoskeletal:     Cervical back: Normal range of motion and neck supple.  Skin:    General: Skin is warm and dry.     Capillary Refill: Capillary refill takes less than 2 seconds.  Neurological:     General: No focal deficit present.     Mental Status: She is alert and oriented to person, place, and time. Mental status is at baseline.  Psychiatric:        Mood and Affect: Mood normal.        Behavior: Behavior normal.        Thought Content: Thought content normal.        Judgment: Judgment normal.     No results found for this or any previous visit.    Assessment & Plan:   Problem List Items Addressed This Visit       Other   Depression, recurrent (HCC) - Primary    Chronic.  Would like to change from Zoloft back to Lexapro 10mg .  Medication sent to the pharmacy.   Return to clinic in 1 month for reevaluation.  Call sooner if concerns arise.        Relevant Medications   escitalopram (LEXAPRO) 10 MG tablet   Anxiety    Chronic.  Would like  to change from Zoloft back to Lexapro 10mg .  Medication sent to the pharmacy.   Return to clinic in 1 month for reevaluation.  Call sooner if concerns arise.       Relevant Medications   escitalopram (LEXAPRO) 10 MG tablet   Other Visit Diagnoses     Encounter for initial prescription of intrauterine contraceptive device (IUD)       Relevant Orders   Ambulatory referral to Gynecology        Follow up plan: Return in about 1 month (around 12/12/2021) for Physical and Fasting labs.

## 2021-11-12 NOTE — Assessment & Plan Note (Signed)
Chronic.  Would like to change from Zoloft back to Lexapro 10mg.  Medication sent to the pharmacy.   Return to clinic in 1 month for reevaluation.  Call sooner if concerns arise.  

## 2021-11-12 NOTE — Assessment & Plan Note (Signed)
Chronic.  Would like to change from Zoloft back to Lexapro 10mg .  Medication sent to the pharmacy.   Return to clinic in 1 month for reevaluation.  Call sooner if concerns arise.

## 2021-11-26 ENCOUNTER — Ambulatory Visit (INDEPENDENT_AMBULATORY_CARE_PROVIDER_SITE_OTHER): Payer: Medicaid Other | Admitting: Obstetrics

## 2021-11-26 ENCOUNTER — Encounter: Payer: Self-pay | Admitting: Obstetrics

## 2021-11-26 VITALS — BP 121/75 | HR 69 | Ht 63.0 in | Wt 176.0 lb

## 2021-11-26 DIAGNOSIS — Z3202 Encounter for pregnancy test, result negative: Secondary | ICD-10-CM

## 2021-11-26 DIAGNOSIS — Z975 Presence of (intrauterine) contraceptive device: Secondary | ICD-10-CM

## 2021-11-26 DIAGNOSIS — Z30430 Encounter for insertion of intrauterine contraceptive device: Secondary | ICD-10-CM

## 2021-11-26 LAB — POCT URINE PREGNANCY: Preg Test, Ur: NEGATIVE

## 2021-11-26 MED ORDER — PARAGARD INTRAUTERINE COPPER IU IUD
1.0000 | INTRAUTERINE_SYSTEM | Freq: Once | INTRAUTERINE | Status: AC
  Administered 2021-11-26: 1 via INTRAUTERINE

## 2021-11-26 NOTE — Addendum Note (Signed)
Addended by: Loney Laurence on: 11/26/2021 12:03 PM   Modules accepted: Orders

## 2021-11-26 NOTE — Progress Notes (Addendum)
ENCOUNTER FOR IUD INSERTION   Subjective  Stacey Mccarty is a 21 y.o. [id number] who presents today for IUD insertion. She desires reversible long-term contraception. We have thoroughly reviewed the risks, benefits, and alternatives, and she has elected to proceed with Paragard insertion.   Objective BP 121/75   Pulse 69   Ht 5\' 3"  (1.6 m)   Wt 176 lb (79.8 kg)   LMP 09/15/2021 (Approximate)   Breastfeeding No   BMI 31.18 kg/m   UPT: negative  Procedure Note Consent was obtained prior to the procedure. A bimanual exam was performed to determine the position of the uterus. A sterile speculum was placed in the vagina, and the cervix was visualized. Chlorhexadine was applied to the cervix. An allis clamp was placed on the anterior lip of the cervix, and gentle traction was applied to straighten and stabilize it. The uterus was sounded to about 7.5 cm. The IUD was inserted to the appropriate depth and the insertion tool was removed. The strings were trimmed to about 3 cm. Bleeding was minimal. [name] tolerated the procedure well. Post-procedure care and warning signs were reviewed with [name], and she verbalized understanding.  Follow up in 6 weeks as needed for annual visit.  Vernona Rieger, CNM

## 2021-11-26 NOTE — Addendum Note (Signed)
Addended by: Loney Laurence on: 11/26/2021 11:54 AM   Modules accepted: Orders

## 2021-12-04 ENCOUNTER — Encounter: Payer: Self-pay | Admitting: Obstetrics

## 2021-12-05 ENCOUNTER — Emergency Department
Admission: EM | Admit: 2021-12-05 | Discharge: 2021-12-05 | Disposition: A | Discharge status: Home / Self Care | Payer: Medicaid Other | Attending: Emergency Medicine | Admitting: Emergency Medicine

## 2021-12-05 ENCOUNTER — Encounter: Payer: Self-pay | Admitting: Emergency Medicine

## 2021-12-05 ENCOUNTER — Emergency Department: Payer: Medicaid Other

## 2021-12-05 ENCOUNTER — Other Ambulatory Visit: Payer: Self-pay

## 2021-12-05 DIAGNOSIS — I10 Essential (primary) hypertension: Secondary | ICD-10-CM | POA: Insufficient documentation

## 2021-12-05 DIAGNOSIS — K8050 Calculus of bile duct without cholangitis or cholecystitis without obstruction: Secondary | ICD-10-CM

## 2021-12-05 DIAGNOSIS — K8020 Calculus of gallbladder without cholecystitis without obstruction: Secondary | ICD-10-CM

## 2021-12-05 DIAGNOSIS — R109 Unspecified abdominal pain: Secondary | ICD-10-CM | POA: Diagnosis present

## 2021-12-05 HISTORY — DX: Obesity, unspecified: E66.9

## 2021-12-05 LAB — URINALYSIS, ROUTINE W REFLEX MICROSCOPIC
Bilirubin Urine: NEGATIVE
Glucose, UA: NEGATIVE mg/dL
Hgb urine dipstick: NEGATIVE
Ketones, ur: NEGATIVE mg/dL
Nitrite: NEGATIVE
Protein, ur: NEGATIVE mg/dL
Specific Gravity, Urine: 1.031 — ABNORMAL HIGH (ref 1.005–1.030)
pH: 6 (ref 5.0–8.0)

## 2021-12-05 LAB — CBC WITH DIFFERENTIAL/PLATELET
Abs Immature Granulocytes: 0.03 10*3/uL (ref 0.00–0.07)
Basophils Absolute: 0 10*3/uL (ref 0.0–0.1)
Basophils Relative: 0 %
Eosinophils Absolute: 0.1 10*3/uL (ref 0.0–0.5)
Eosinophils Relative: 1 %
HCT: 40 % (ref 36.0–46.0)
Hemoglobin: 13.2 g/dL (ref 12.0–15.0)
Immature Granulocytes: 0 %
Lymphocytes Relative: 31 %
Lymphs Abs: 3 10*3/uL (ref 0.7–4.0)
MCH: 28.2 pg (ref 26.0–34.0)
MCHC: 33 g/dL (ref 30.0–36.0)
MCV: 85.5 fL (ref 80.0–100.0)
Monocytes Absolute: 0.7 10*3/uL (ref 0.1–1.0)
Monocytes Relative: 7 %
Neutro Abs: 5.8 10*3/uL (ref 1.7–7.7)
Neutrophils Relative %: 61 %
Platelets: 270 10*3/uL (ref 150–400)
RBC: 4.68 MIL/uL (ref 3.87–5.11)
RDW: 12.8 % (ref 11.5–15.5)
WBC: 9.6 10*3/uL (ref 4.0–10.5)
nRBC: 0 % (ref 0.0–0.2)

## 2021-12-05 LAB — COMPREHENSIVE METABOLIC PANEL
ALT: 27 U/L (ref 0–44)
AST: 34 U/L (ref 15–41)
Albumin: 4.7 g/dL (ref 3.5–5.0)
Alkaline Phosphatase: 112 U/L (ref 38–126)
Anion gap: 8 (ref 5–15)
BUN: 29 mg/dL — ABNORMAL HIGH (ref 6–20)
CO2: 23 mmol/L (ref 22–32)
Calcium: 9.6 mg/dL (ref 8.9–10.3)
Chloride: 109 mmol/L (ref 98–111)
Creatinine, Ser: 0.78 mg/dL (ref 0.44–1.00)
GFR, Estimated: >60 mL/min (ref >60)
Glucose, Bld: 99 mg/dL (ref 70–99)
Potassium: 4.4 mmol/L (ref 3.5–5.1)
Sodium: 140 mmol/L (ref 135–145)
Total Bilirubin: 0.8 mg/dL (ref 0.3–1.2)
Total Protein: 8 g/dL (ref 6.5–8.1)

## 2021-12-05 LAB — POC URINE PREG, ED: Preg Test, Ur: NEGATIVE

## 2021-12-05 LAB — LIPASE, BLOOD: Lipase: 41 U/L (ref 11–51)

## 2021-12-05 MED ORDER — ONDANSETRON HCL 4 MG/2ML IJ SOLN
4.0000 mg | INTRAMUSCULAR | Status: AC
  Administered 2021-12-05: 4 mg via INTRAVENOUS
  Filled 2021-12-05: qty 2

## 2021-12-05 MED ORDER — KETOROLAC TROMETHAMINE 30 MG/ML IJ SOLN
15.0000 mg | Freq: Once | INTRAMUSCULAR | Status: AC
  Administered 2021-12-05: 15 mg via INTRAVENOUS
  Filled 2021-12-05: qty 1

## 2021-12-05 MED ORDER — OXYCODONE-ACETAMINOPHEN 5-325 MG PO TABS: 2.0000 | ORAL_TABLET | Freq: Four times a day (QID) | ORAL | 0 refills | Status: DC | PRN

## 2021-12-05 MED ORDER — ONDANSETRON 4 MG PO TBDP: ORAL_TABLET | ORAL | 0 refills | Status: DC

## 2021-12-05 MED ORDER — DOCUSATE SODIUM 100 MG PO CAPS: ORAL_CAPSULE | ORAL | 0 refills | Status: DC

## 2021-12-05 MED ORDER — MORPHINE SULFATE (PF) 4 MG/ML IV SOLN
4.0000 mg | Freq: Once | INTRAVENOUS | Status: AC
  Administered 2021-12-05: 4 mg via INTRAVENOUS
  Filled 2021-12-05: qty 1

## 2021-12-05 NOTE — ED Notes (Signed)
US at the bedside

## 2021-12-05 NOTE — ED Notes (Signed)
ED Provider at bedside. 

## 2021-12-05 NOTE — ED Provider Notes (Signed)
[facility] Provider Note    Event Date/Time   First MD Initiated Contact with Patient 12/05/21 [phone]     (approximate)   History   Abdominal Pain   HPI  Stacey Mccarty is a 21 y.o. female whose only significant medical history includes obesity.  She presents for evaluation upper abdominal pain, nausea, and vomiting.  She said that she had an IUD placed about a week ago.  Since then she has had intermittent symptoms of abdominal pain and cramping, nausea, vomiting, and occasional diarrhea.  She said that she was not having the symptoms prior to the IUD placement and that it feels sort of like labor cramps (she has had 2 children), but that she is able to go a day or 2 without any pain and then will come back.  Tonight it woke her up at around 1 AM and it has not gone away.  The pain is constant and nothing in particular makes it better or worse.  She denies chest pain, shortness of breath, and dysuria, and she has had no prior abdominal surgeries.     Physical Exam   Triage Vital Signs: ED Triage Vitals  Enc Vitals Group     BP 12/05/21 0517 (!) 144/89     Pulse Rate 12/05/21 0517 74     Resp 12/05/21 0517 18     Temp 12/05/21 0517 97.6 F (36.4 C)     Temp Source 12/05/21 0517 Oral     SpO2 12/05/21 0517 100 %     Weight 12/05/21 0502 79.4 kg (175 lb)     Height 12/05/21 0502 1.6 m (5\' 3" )     Head Circumference --      Peak Flow --      Pain Score 12/05/21 0502 10     Pain Loc --      Pain Edu? --      Excl. in GC? --     Most recent vital signs: Vitals:   12/05/21 0517 12/05/21 0630  BP: (!) 144/89 133/82  Pulse: 74 84  Resp: 18   Temp: 97.6 F (36.4 C)   SpO2: 100% 100%     General: Awake, appears uncomfortable. CV:  Good peripheral perfusion.  Regular rate and rhythm Resp:  Normal effort.  Speaking clearly and comfortably, no accessory muscle usage. Abd:  Obese.  No lower abdominal tenderness to palpation with no rebound or  guarding.  Patient guards a little bit and has tenderness to palpation of the epigastrium but has substantial tenderness to palpation in the right upper quadrant with positive Murphy sign. Other:  Patient is anxious but appropriately so under the circumstances.   ED Results / Procedures / Treatments   Labs (all labs ordered are listed, but only abnormal results are displayed) Labs Reviewed  COMPREHENSIVE METABOLIC PANEL - Abnormal; Notable for the following components:      Result Value   BUN 29 (*)    All other components within normal limits  URINALYSIS, ROUTINE W REFLEX MICROSCOPIC - Abnormal; Notable for the following components:   Color, Urine YELLOW (*)    APPearance CLOUDY (*)    Specific Gravity, Urine 1.031 (*)    Leukocytes,Ua LARGE (*)    Bacteria, UA RARE (*)    All other components within normal limits  URINE CULTURE  CBC WITH DIFFERENTIAL/PLATELET  LIPASE, BLOOD  POC URINE PREG, ED     RADIOLOGY See hospital course for details: Gallstones without evidence of  cholecystitis.    PROCEDURES:  Critical Care performed: No  Procedures   MEDICATIONS ORDERED IN ED: Medications  morphine (PF) 4 MG/ML injection 4 mg (4 mg Intravenous Given 12/05/21 0610)  ketorolac (TORADOL) 30 MG/ML injection 15 mg (15 mg Intravenous Given 12/05/21 0610)  ondansetron (ZOFRAN) injection 4 mg (4 mg Intravenous Given 12/05/21 0610)     IMPRESSION / MDM / ASSESSMENT AND PLAN / ED COURSE  I reviewed the triage vital signs and the nursing notes.                              Differential diagnosis includes, but is not limited to, gallbladder disease, pancreatitis, IUD malfunction or malposition, medication/drug/hormone side effects, electrolyte or metabolic abnormality, viral gastroenteritis, SBO/ileus.  Patient's presentation is most consistent with acute presentation with potential threat to life or bodily function.  Vital signs are essentially normal other than some mild  hypertension.  Patient appears uncomfortable but nontoxic.  She is most concerned about a complication of her recent IUD placement, but her physical exam strongly suggests gallbladder disease.  I talked with her about this and the need to further evaluate for this possibility.  Labs/studies ordered: CBC with differential, CMP, lipase, urinalysis, POC pregnancy test.  Also ordered right upper quadrant ultrasound.  Medications ordered: Morphine 4 mg IV, Zofran 4 mg IV, Toradol 15 mg IV.    Clinical Course as of 12/05/21 0718  Thu Dec 05, 2021  0713 I viewed and interpreted the patient's ultrasound.  Gallstones are obvious but there is no evidence of cholecystitis.  The radiology report mentions that a gallstone is lodged in the neck of the gallbladder.  I reassessed the patient and her pain has completely resolved.  She said that it went away a little while ago and she has no persistent symptoms.  I reassessed her and she has no tenderness to palpation at this time, no guarding, negative Murphy sign.  I considered and anticipated admission given the discomfort she was in previously, but now that her pain has completely resolved, I think she can follow-up as an outpatient.  Additionally, her CBC, metabolic panel, and lipase are all within normal limits and she has no LFT elevation.  There is no evidence of choledocholithiasis at this time.  I had an extensive conversation with her about biliary colic and the other forms of gallbladder disease.  I am discharging her with pain medicine and Zofran and strongly encouraged her to call Dr. Tonna Boehringer at the next available opportunity to schedule follow-up appointment.  I gave strict return precautions.  I checked the [place] controlled substance database and verified that there are no concerning prescribing habits.  She understands and agrees with the plan. [CF]    Clinical Course User Index [CF] Loleta Rose, MD     FINAL CLINICAL IMPRESSION(S) /  ED DIAGNOSES   Final diagnoses:  Biliary colic  Gallstones     Rx / DC Orders   ED Discharge Orders          Ordered    oxyCODONE-acetaminophen (PERCOCET) 5-325 MG tablet  Every 6 hours PRN        12/05/21 0716    ondansetron (ZOFRAN-ODT) 4 MG disintegrating tablet        12/05/21 0716    docusate sodium (COLACE) 100 MG capsule        12/05/21 0716  Note:  This document was prepared using Dragon voice recognition software and may include unintentional dictation errors.   Loleta Rose, MD 12/05/21 [phone]

## 2021-12-05 NOTE — ED Triage Notes (Signed)
Patient ambulatory to triage with steady gait, without difficulty or distress noted; pt reports generalized abd pain accomp by N/V since 1am

## 2021-12-05 NOTE — ED Notes (Signed)
5397 meds given and IV dislodged after morphine and zofran. MD York Cerise notified and ok to give toradol IM.

## 2021-12-05 NOTE — Discharge Instructions (Addendum)
You have been seen in the Emergency Department (ED) for abdominal pain.  Your evaluation suggests that your pain is caused by gallstones.  Fortunately you do not need immediate surgery at this time, but it is important that you follow up with a surgeon as an outpatient; typically surgical removal of the gallbladder is the only thing that will definitively fix your issue.  Read through the included information about a bland diet, and use any prescribed medications as instructed.  Avoid smoking and alcohol use. ° °Please follow up as instructed above regarding today’s emergent visit and the symptoms that are bothering you. ° °Take Percocet as prescribed. Do not drink alcohol, drive or participate in any other potentially dangerous activities while taking this medication as it may make you sleepy. Do not take this medication with any other sedating medications, either prescription or over-the-counter. If you were prescribed Percocet or Vicodin, do not take these with acetaminophen (Tylenol) as it is already contained within these medications. °  °This medication is an opiate (or narcotic) pain medication and can be habit forming.  Use it as little as possible to achieve adequate pain control.  Do not use or use it with extreme caution if you have a history of opiate abuse or dependence.  If you are on a pain contract with your primary care doctor or a pain specialist, be sure to let them know you were prescribed this medication today from the [facility].  This medication is intended for your use only - do not give any to anyone else and keep it in a secure place where nobody else, especially children, have access to it.  It will also cause or worsen constipation, so you may want to consider taking an over-the-counter stool softener while you are taking this medication. ° °Return to the ED if your abdominal pain worsens or fails to improve, you develop bloody vomiting, bloody diarrhea, you  are unable to tolerate fluids due to vomiting, fever greater than 101, or other symptoms that concern you. ° °

## 2021-12-06 LAB — URINE CULTURE: Culture: <10000 — AB

## 2021-12-17 ENCOUNTER — Encounter: Payer: Medicaid Other | Admitting: Nurse Practitioner

## 2021-12-17 DIAGNOSIS — F339 Major depressive disorder, recurrent, unspecified: Secondary | ICD-10-CM

## 2021-12-17 DIAGNOSIS — Z118 Encounter for screening for other infectious and parasitic diseases: Secondary | ICD-10-CM

## 2021-12-17 DIAGNOSIS — Z114 Encounter for screening for human immunodeficiency virus [HIV]: Secondary | ICD-10-CM

## 2021-12-17 DIAGNOSIS — F419 Anxiety disorder, unspecified: Secondary | ICD-10-CM

## 2021-12-17 DIAGNOSIS — Z1159 Encounter for screening for other viral diseases: Secondary | ICD-10-CM

## 2021-12-17 DIAGNOSIS — Z136 Encounter for screening for cardiovascular disorders: Secondary | ICD-10-CM

## 2021-12-17 DIAGNOSIS — Z0000 Encounter for general adult medical examination without abnormal findings: Secondary | ICD-10-CM

## 2021-12-20 ENCOUNTER — Encounter: Payer: Self-pay | Admitting: Physician Assistant

## 2021-12-20 ENCOUNTER — Ambulatory Visit (INDEPENDENT_AMBULATORY_CARE_PROVIDER_SITE_OTHER): Payer: Medicaid Other | Admitting: Physician Assistant

## 2021-12-20 VITALS — BP 116/80 | HR 89 | Temp 97.9°F | Wt 176.7 lb

## 2021-12-20 DIAGNOSIS — F339 Major depressive disorder, recurrent, unspecified: Secondary | ICD-10-CM | POA: Diagnosis not present

## 2021-12-20 DIAGNOSIS — K8050 Calculus of bile duct without cholangitis or cholecystitis without obstruction: Secondary | ICD-10-CM

## 2021-12-20 NOTE — Progress Notes (Signed)
Established Patient Office Visit  Name: Stacey Mccarty   MRN: [id number]    DOB: 04/28/2000   Date:12/23/2021  Today's Provider: Jacquelin Hawking, MHS, PA-C Introduced myself to the patient as a PA-C and provided education on APPs in clinical practice.         Subjective  Chief Complaint  Chief Complaint  Patient presents with   Anxiety    Pt states she and her husband are enlisting in the [employer] soon and would like to discuss the Lexapro   Referral    Pt states she would like a referral to gastroenterology for gallstones and biliary colic. Would like to see Dr.     Lethea Killings Symptoms include nausea. Patient reports no nervous/anxious behavior.     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 Reports she has been engaging in other hobbies outside of work as well which have helped manage her anxiety and depression        12/20/2021    1:41 PM 11/12/2021    1:47 PM 08/12/2021    2:51 PM 07/12/2021    8:15 AM  GAD 7 : Generalized Anxiety Score  Nervous, Anxious, on Edge 0 2 1 1   Control/stop worrying 1 3 1 1   Worry too much - different things 1 3 1 1   Trouble relaxing 0 3 3 3   Restless 0 3 3 3   Easily annoyed or irritable 2 2 2 3   Afraid - awful might happen 2 1 0 1  Total GAD 7 Score 6 17 11 13   Anxiety Difficulty Somewhat difficult Very difficult Somewhat difficult Not difficult at all       12/20/2021    1:41 PM 11/12/2021    1:47 PM 08/12/2021    2:51 PM 07/12/2021    8:15 AM 06/19/2021    9:20 AM  Depression screen PHQ 2/9  Decreased Interest 0 1 2 1    Down, Depressed, Hopeless 0 1 2 1    PHQ - 2  Score 0 2 4 2    Altered sleeping 3 3 0 3   Tired, decreased energy 2 3 2 3    Change in appetite 1 3 3 3    Feeling bad or failure about yourself  0 3 3 1    Trouble concentrating 1 1 0 1   Moving slowly or fidgety/restless 0 1 2 1    Suicidal thoughts 0 0 0 0   PHQ-9 Score 7 16 14 14    Difficult doing work/chores Somewhat difficult Very difficult Very difficult Somewhat difficult      Information is confidential and restricted. Go to Review Flowsheets to unlock data.    Biliary colic and gallstones Reports she went to the ED on 12/05/21 and was diagnosed with biliary colic and gallstones Reports she was seen by [facility] clinic but they did not accept her insurance States she would like to go to [place]   She reports she has been watching her fat intake a lot since her visit to the ED  Has mostly been eating chicken and  rice type diet to meet fitness goals    Patient Active Problem List   Diagnosis Date Noted   Biliary colic 12/23/2021   Depression, recurrent (HCC) 05/08/2021   Anxiety 05/08/2021    History reviewed. No pertinent surgical history.  Family History  Problem Relation Age of Onset   Bipolar disorder Mother    Schizophrenia Mother    Depression Mother    Crohn's disease Mother    Schizophrenia Father    Other Father    Epilepsy Sister    Autism Sister    Autism Maternal Grandfather     Social History   Tobacco Use   Smoking status: Never   Smokeless tobacco: Never  Substance Use Topics   Alcohol use: Never     Current Outpatient Medications:    docusate sodium (COLACE) 100 MG capsule, Take 1 tablet once or twice daily as needed for constipation while taking narcotic pain medicine (Patient not taking: Reported on 12/20/2021), Disp: 30 capsule, Rfl: 0   escitalopram (LEXAPRO) 10 MG tablet, Take 1 tablet (10 mg total) by mouth daily. (Patient not taking: Reported on 12/20/2021), Disp: 60 tablet, Rfl: 0   ondansetron (ZOFRAN-ODT) 4 MG disintegrating  tablet, Allow 1-2 tablets to dissolve in your mouth every 8 hours as needed for nausea/vomiting (Patient not taking: Reported on 12/20/2021), Disp: 30 tablet, Rfl: 0   oxyCODONE-acetaminophen (PERCOCET) 5-325 MG tablet, Take 2 tablets by mouth every 6 (six) hours as needed for severe pain. (Patient not taking: Reported on 12/20/2021), Disp: 20 tablet, Rfl: 0   Prenatal Vit-Fe Fumarate-FA (PRENATAL MULTIVITAMIN) TABS tablet, Take 1 tablet by mouth daily at 12 noon. (Patient not taking: Reported on 12/20/2021), Disp: , Rfl:   Allergies  Allergen Reactions   Iodine Nausea And Vomiting   Latex Rash    I personally reviewed active problem list, medication list, allergies, notes from last encounter, lab results, imaging with the patient/caregiver today.   Review of Systems  Eyes:  Negative for blurred vision and double vision.  Gastrointestinal:  Positive for nausea. Negative for abdominal pain, blood in stool, diarrhea, heartburn and vomiting.  Psychiatric/Behavioral:  Negative for depression. The patient is not nervous/anxious.       Objective  Vitals:   12/20/21 1330  BP: 116/80  Pulse: 89  Temp: 97.9 F (36.6 C)  TempSrc: Oral  SpO2: 98%  Weight: 176 lb 11.2 oz (80.2 kg)    Body mass index is 31.3 kg/m.  Physical Exam Vitals reviewed.  Constitutional:      General: She is awake.     Appearance: Normal appearance. She is well-developed and well-groomed.     Comments: Active with children in room- holding them and engaging in conversation   HENT:     Head: Normocephalic and atraumatic.  Pulmonary:     Effort: Pulmonary effort is normal.  Neurological:     General: No focal deficit present.     Mental Status: She is alert and oriented to person, place, and time.     GCS: GCS eye subscore is 4. GCS verbal subscore is 5. GCS motor subscore is 6.     Cranial Nerves: Cranial nerves 2-12 are intact.     Motor: Motor function is intact.     Coordination: Coordination is  intact.     Gait: Gait is intact.  Psychiatric:        Attention and Perception: Attention and perception normal.        Mood and Affect: Mood and  affect normal.        Speech: Speech normal.        Behavior: Behavior normal. Behavior is cooperative.        Thought Content: Thought content normal.        Cognition and Memory: Cognition and memory normal.        Judgment: Judgment normal.      Recent Results (from the past 2160 hour(s))  POCT urine pregnancy     Status: Normal   Collection Time: 11/26/21 11:53 AM  Result Value Ref Range   Preg Test, Ur Negative Negative  CBC with Differential     Status: None   Collection Time: 12/05/21  6:02 AM  Result Value Ref Range   WBC 9.6 4.0 - 10.5 K/uL   RBC 4.68 3.87 - 5.11 MIL/uL   Hemoglobin 13.2 12.0 - 15.0 g/dL   HCT 16.1 09.6 - 04.5 %   MCV 85.5 80.0 - 100.0 fL   MCH 28.2 26.0 - 34.0 pg   MCHC 33.0 30.0 - 36.0 g/dL   RDW 40.9 81.1 - 91.4 %   Platelets 270 150 - 400 K/uL   nRBC 0.0 0.0 - 0.2 %   Neutrophils Relative % 61 %   Neutro Abs 5.8 1.7 - 7.7 K/uL   Lymphocytes Relative 31 %   Lymphs Abs 3.0 0.7 - 4.0 K/uL   Monocytes Relative 7 %   Monocytes Absolute 0.7 0.1 - 1.0 K/uL   Eosinophils Relative 1 %   Eosinophils Absolute 0.1 0.0 - 0.5 K/uL   Basophils Relative 0 %   Basophils Absolute 0.0 0.0 - 0.1 K/uL   Immature Granulocytes 0 %   Abs Immature Granulocytes 0.03 0.00 - 0.07 K/uL    Comment: Performed at [facility], [address] [place], [place] [address]  Comprehensive metabolic panel     Status: Abnormal   Collection Time: 12/05/21  6:02 AM  Result Value Ref Range   Sodium 140 135 - 145 mmol/L   Potassium 4.4 3.5 - 5.1 mmol/L    Comment: HEMOLYSIS AT THIS LEVEL MAY AFFECT RESULT   Chloride 109 98 - 111 mmol/L   CO2 23 22 - 32 mmol/L   Glucose, Bld 99 70 - 99 mg/dL    Comment: Glucose reference range applies only to samples taken after fasting for at least 8 hours.   BUN 29 (H) 6 - 20 mg/dL    Creatinine, Ser 6.21 0.44 - 1.00 mg/dL   Calcium 9.6 8.9 - 30.8 mg/dL   Total Protein 8.0 6.5 - 8.1 g/dL   Albumin 4.7 3.5 - 5.0 g/dL   AST 34 15 - 41 U/L    Comment: HEMOLYSIS AT THIS LEVEL MAY AFFECT RESULT   ALT 27 0 - 44 U/L    Comment: HEMOLYSIS AT THIS LEVEL MAY AFFECT RESULT   Alkaline Phosphatase 112 38 - 126 U/L   Total Bilirubin 0.8 0.3 - 1.2 mg/dL    Comment: HEMOLYSIS AT THIS LEVEL MAY AFFECT RESULT   GFR, Estimated >60 >60 mL/min    Comment: (NOTE) Calculated using the CKD-EPI Creatinine Equation (2021)    Anion gap 8 5 - 15    Comment: Performed at [facility], [address] [place], [place] [address]  Lipase, blood     Status: None   Collection Time: 12/05/21  6:02 AM  Result Value Ref Range   Lipase 41 11 - 51 U/L    Comment: Performed at [facility]  Lab, [address] [place], [place] [address]  Urinalysis, Routine w reflex microscopic Urine, Clean Catch     Status: Abnormal   Collection Time: 12/05/21  6:02 AM  Result Value Ref Range   Color, Urine YELLOW (A) YELLOW   APPearance CLOUDY (A) CLEAR   Specific Gravity, Urine 1.031 (H) 1.005 - 1.030   pH 6.0 5.0 - 8.0   Glucose, UA NEGATIVE NEGATIVE mg/dL   Hgb urine dipstick NEGATIVE NEGATIVE   Bilirubin Urine NEGATIVE NEGATIVE   Ketones, ur NEGATIVE NEGATIVE mg/dL   Protein, ur NEGATIVE NEGATIVE mg/dL   Nitrite NEGATIVE NEGATIVE   Leukocytes,Ua LARGE (A) NEGATIVE   RBC / HPF 0-5 0 - 5 RBC/hpf   WBC, UA 21-50 0 - 5 WBC/hpf   Bacteria, UA RARE (A) NONE SEEN   Squamous Epithelial / LPF 21-50 0 - 5   Mucus PRESENT     Comment: Performed at [facility], [address] [place], [place] [address]  Urine Culture     Status: Abnormal   Collection Time: 12/05/21  6:02 AM   Specimen: Urine, Clean Catch  Result Value Ref Range   Specimen Description      URINE, CLEAN CATCH Performed at [facility], [address] [place], [place] [address]    Special Requests       NONE Performed at [facility], [address], [place], [place] [address]    Culture (A)     <10,000 COLONIES/mL INSIGNIFICANT GROWTH Performed at [facility], [address] [place], [place] [address]    Report Status 12/06/2021 FINAL   POC Urine Pregnancy, ED     Status: None   Collection Time: 12/05/21  6:04 AM  Result Value Ref Range   Preg Test, Ur Negative Negative     PHQ2/9:    12/20/2021    1:41 PM 11/12/2021    1:47 PM 08/12/2021    2:51 PM 07/12/2021    8:15 AM 06/19/2021    9:20 AM  Depression screen PHQ 2/9  Decreased Interest 0 1 2 1    Down, Depressed, Hopeless 0 1 2 1    PHQ - 2 Score 0 2 4 2    Altered sleeping 3 3 0 3   Tired, decreased energy 2 3 2 3    Change in appetite 1 3 3 3    Feeling bad or failure about yourself  0 3 3 1    Trouble concentrating 1 1 0 1   Moving slowly or fidgety/restless 0 1 2 1    Suicidal thoughts 0 0 0 0   PHQ-9 Score 7 16 14 14    Difficult doing work/chores Somewhat difficult Very difficult Very difficult Somewhat difficult      Information is confidential and restricted. Go to Review Flowsheets to unlock data.      Fall Risk:    12/20/2021    1:40 PM 11/12/2021    1:47 PM 08/12/2021    2:51 PM 07/12/2021    8:14 AM 05/22/2021   11:12 AM  Fall Risk   Falls in the past year? 0 0 0 0 0  Number falls in past yr: 0 0 0 0 0  Injury with Fall? 0 0 0 0 0  Risk for fall due to : No Fall Risks No Fall Risks No Fall Risks No Fall Risks No Fall Risks  Follow up Falls evaluation completed Falls evaluation completed Falls evaluation completed Falls evaluation completed Falls evaluation completed  Functional Status Survey:      Assessment & Plan  Problem List Items Addressed This Visit       Other   Depression, recurrent (HCC)    Patient reports significant improvement in symptoms since she has pursued hobbies, changed jobs PHQ and GAD7 are overall mild in scores  She has not been on medication for almost  month and would like her dx to be transitioned to "in remission" at this time Reviewed that she should discuss this with her PCP to make sure she is adequately monitored for resurgence of symptoms and need for medication restart.  Follow up with PCP recommended       Biliary colic - Primary    Acute, newly diagnosed Patient was diagnosed in ED with biliary colic and wishes to speak with surgery to discuss options She reports some improvement in symptoms with dietary changes Referral to General Surgery placed per request Follow up as needed        Relevant Orders   Ambulatory referral to General Surgery     No follow-ups on file.   I, Amaryllis Malmquist E Javani Spratt, PA-C, have reviewed all documentation for this visit. The documentation on 12/23/21 for the exam, diagnosis, procedures, and orders are all accurate and complete.   Jacquelin Hawking, MHS, PA-C [facility]

## 2021-12-23 ENCOUNTER — Ambulatory Visit (INDEPENDENT_AMBULATORY_CARE_PROVIDER_SITE_OTHER): Payer: Medicaid Other | Admitting: Nurse Practitioner

## 2021-12-23 ENCOUNTER — Encounter: Payer: Self-pay | Admitting: Nurse Practitioner

## 2021-12-23 VITALS — BP 137/68 | HR 93 | Temp 98.3°F | Ht 63.0 in | Wt 175.6 lb

## 2021-12-23 DIAGNOSIS — F419 Anxiety disorder, unspecified: Secondary | ICD-10-CM | POA: Diagnosis not present

## 2021-12-23 DIAGNOSIS — F339 Major depressive disorder, recurrent, unspecified: Secondary | ICD-10-CM

## 2021-12-23 DIAGNOSIS — Z1159 Encounter for screening for other viral diseases: Secondary | ICD-10-CM

## 2021-12-23 DIAGNOSIS — Z136 Encounter for screening for cardiovascular disorders: Secondary | ICD-10-CM

## 2021-12-23 DIAGNOSIS — Z0000 Encounter for general adult medical examination without abnormal findings: Secondary | ICD-10-CM | POA: Diagnosis not present

## 2021-12-23 DIAGNOSIS — Z118 Encounter for screening for other infectious and parasitic diseases: Secondary | ICD-10-CM

## 2021-12-23 DIAGNOSIS — K8050 Calculus of bile duct without cholangitis or cholecystitis without obstruction: Secondary | ICD-10-CM | POA: Insufficient documentation

## 2021-12-23 DIAGNOSIS — Z114 Encounter for screening for human immunodeficiency virus [HIV]: Secondary | ICD-10-CM

## 2021-12-23 NOTE — Assessment & Plan Note (Signed)
Chronic.  Controlled.  Continue with current medication regimen of therapeutic exercises.  Labs ordered today.  Return to clinic in 6 months for reevaluation.  Call sooner if concerns arise.   

## 2021-12-23 NOTE — Assessment & Plan Note (Signed)
Chronic.  Controlled.  Continue with current medication regimen of therapeutic exercises.  Labs ordered today.  Return to clinic in 6 months for reevaluation.  Call sooner if concerns arise.

## 2021-12-23 NOTE — Assessment & Plan Note (Addendum)
Patient reports significant improvement in symptoms since she has pursued hobbies, changed jobs PHQ and GAD7 are overall mild in scores  She has not been on medication for almost month and would like her dx to be transitioned to "in remission" at this time Reviewed that she should discuss this with her PCP to make sure she is adequately monitored for resurgence of symptoms and need for medication restart.  Follow up with PCP recommended

## 2021-12-23 NOTE — Assessment & Plan Note (Signed)
Acute, newly diagnosed Patient was diagnosed in ED with biliary colic and wishes to speak with surgery to discuss options She reports some improvement in symptoms with dietary changes Referral to General Surgery placed per request Follow up as needed

## 2021-12-23 NOTE — Progress Notes (Unsigned)
BP 137/68   Pulse 93   Temp 98.3 F (36.8 C) (Oral)   Ht 5\' 3"  (1.6 m)   Wt 175 lb 9.6 oz (79.7 kg)   SpO2 97%   BMI 31.11 kg/m    Subjective:    Patient ID: Stacey Mccarty, female    DOB: 03-Sep-2000, 21 y.o.   MRN: [id number]  HPI: Stacey Mccarty is a 21 y.o. female presenting on 12/23/2021 for comprehensive medical examination. Current medical complaints include:none  She currently lives with: Menopausal Symptoms: no  MOOD Patient states she is no longer taking the Lexapro.  She stopped taking it about a month ago.  She forget to take it for 3 days in a row and she started doing emotional exercises which is helping her a lot.  She is also watching therapy sessions online.  Denies SI.    Depression Screen done today and results listed below:     12/23/2021    3:44 PM 12/20/2021    1:41 PM 11/12/2021    1:47 PM 08/12/2021    2:51 PM 07/12/2021    8:15 AM  Depression screen PHQ 2/9  Decreased Interest 0 0 1 2 1   Down, Depressed, Hopeless 0 0 1 2 1   PHQ - 2 Score 0 0 2 4 2   Altered sleeping 2 3 3  0 3  Tired, decreased energy 2 2 3 2 3   Change in appetite 3 1 3 3 3   Feeling bad or failure about yourself  0 0 3 3 1   Trouble concentrating 0 1 1 0 1  Moving slowly or fidgety/restless 1 0 1 2 1   Suicidal thoughts 0 0 0 0 0  PHQ-9 Score 8 7 16 14 14   Difficult doing work/chores Somewhat difficult Somewhat difficult Very difficult Very difficult Somewhat difficult    The patient does not have a history of falls. I did complete a risk assessment for falls. A plan of care for falls was documented.   Past Medical History:  Past Medical History:  Diagnosis Date   Anemia    Obesity     Surgical History:  History reviewed. No pertinent surgical history.  Medications:  No current outpatient medications on file prior to visit.   No current facility-administered medications on file prior to visit.    Allergies:  Allergies  Allergen Reactions   Iodine Nausea And Vomiting    Latex Rash    Social History:  Social History   Socioeconomic History   Marital status: Married    Spouse name: Not on file   Number of children: Not on file   Years of education: Not on file   Highest education level: Not on file  Occupational History   Not on file  Tobacco Use   Smoking status: Never   Smokeless tobacco: Never  Vaping Use   Vaping Use: Never used  Substance and Sexual Activity   Alcohol use: Never   Drug use: Never   Sexual activity: Yes  Other Topics Concern   Not on file  Social History Narrative   Not on file   Social Determinants of Health   Financial Resource Strain: Not on file  Food Insecurity: Not on file  Transportation Needs: Not on file  Physical Activity: Not on file  Stress: Not on file  Social Connections: Not on file  Intimate Partner Violence: Not on file   Social History   Tobacco Use  Smoking Status Never  Smokeless Tobacco Never   Social History  Substance and Sexual Activity  Alcohol Use Never    Family History:  Family History  Problem Relation Age of Onset   Bipolar disorder Mother    Schizophrenia Mother    Depression Mother    Crohn's disease Mother    Schizophrenia Father    Other Father    Epilepsy Sister    Autism Sister    Autism Maternal Grandfather     Past medical history, surgical history, medications, allergies, family history and social history reviewed with patient today and changes made to appropriate areas of the chart.   Review of Systems  Psychiatric/Behavioral:  Positive for depression. Negative for suicidal ideas. The patient is nervous/anxious.    All other ROS negative except what is listed above and in the HPI.      Objective:    BP 137/68   Pulse 93   Temp 98.3 F (36.8 C) (Oral)   Ht 5\' 3"  (1.6 m)   Wt 175 lb 9.6 oz (79.7 kg)   SpO2 97%   BMI 31.11 kg/m   Wt Readings from Last 3 Encounters:  12/23/21 175 lb 9.6 oz (79.7 kg)  12/20/21 176 lb 11.2 oz (80.2 kg)   12/05/21 175 lb (79.4 kg)    Physical Exam Vitals and nursing note reviewed.  Constitutional:      General: She is awake. She is not in acute distress.    Appearance: Normal appearance. She is well-developed. She is not ill-appearing.  HENT:     Head: Normocephalic and atraumatic.     Right Ear: Hearing, tympanic membrane, ear canal and external ear normal. No drainage.     Left Ear: Hearing, tympanic membrane, ear canal and external ear normal. No drainage.     Nose: Nose normal.     Right Sinus: No maxillary sinus tenderness or frontal sinus tenderness.     Left Sinus: No maxillary sinus tenderness or frontal sinus tenderness.     Mouth/Throat:     Mouth: Mucous membranes are moist.     Pharynx: Oropharynx is clear. Uvula midline. No pharyngeal swelling, oropharyngeal exudate or posterior oropharyngeal erythema.  Eyes:     General: Lids are normal.        Right eye: No discharge.        Left eye: No discharge.     Extraocular Movements: Extraocular movements intact.     Conjunctiva/sclera: Conjunctivae normal.     Pupils: Pupils are equal, round, and reactive to light.     Visual Fields: Right eye visual fields normal and left eye visual fields normal.  Neck:     Thyroid: No thyromegaly.     Vascular: No carotid bruit.     Trachea: Trachea normal.  Cardiovascular:     Rate and Rhythm: Normal rate and regular rhythm.     Heart sounds: Normal heart sounds. No murmur heard.    No gallop.  Pulmonary:     Effort: Pulmonary effort is normal. No accessory muscle usage or respiratory distress.     Breath sounds: Normal breath sounds.  Chest:  Breasts:    Right: Normal.     Left: Normal.  Abdominal:     General: Bowel sounds are normal.     Palpations: Abdomen is soft. There is no hepatomegaly or splenomegaly.     Tenderness: There is no abdominal tenderness.  Musculoskeletal:        General: Normal range of motion.     Cervical back: Normal range of motion and neck  supple.      Right lower leg: No edema.     Left lower leg: No edema.  Lymphadenopathy:     Head:     Right side of head: No submental, submandibular, tonsillar, preauricular or posterior auricular adenopathy.     Left side of head: No submental, submandibular, tonsillar, preauricular or posterior auricular adenopathy.     Cervical: No cervical adenopathy.     Upper Body:     Right upper body: No supraclavicular, axillary or pectoral adenopathy.     Left upper body: No supraclavicular, axillary or pectoral adenopathy.  Skin:    General: Skin is warm and dry.     Capillary Refill: Capillary refill takes less than 2 seconds.     Findings: No rash.  Neurological:     Mental Status: She is alert and oriented to person, place, and time.     Gait: Gait is intact.  Psychiatric:        Attention and Perception: Attention normal.        Mood and Affect: Mood normal.        Speech: Speech normal.        Behavior: Behavior normal. Behavior is cooperative.        Thought Content: Thought content normal.        Judgment: Judgment normal.     Results for orders placed or performed during the hospital encounter of 12/05/21  Urine Culture   Specimen: Urine, Clean Catch  Result Value Ref Range   Specimen Description      URINE, CLEAN CATCH Performed at [facility], [address], [place], [place] [address]    Special Requests      NONE Performed at [facility], [address] [place], [place] [address]    Culture (A)     <10,000 COLONIES/mL INSIGNIFICANT GROWTH Performed at [facility], [address], [place], [place] [address]    Report Status 12/06/2021 FINAL   CBC with Differential  Result Value Ref Range   WBC 9.6 4.0 - 10.5 K/uL   RBC 4.68 3.87 - 5.11 MIL/uL   Hemoglobin 13.2 12.0 - 15.0 g/dL   HCT 62.3 76.2 - 83.1 %   MCV 85.5 80.0 - 100.0 fL   MCH 28.2 26.0 - 34.0 pg   MCHC 33.0 30.0 - 36.0 g/dL   RDW 51.7 61.6 - 07.3 %   Platelets 270 150 - 400  K/uL   nRBC 0.0 0.0 - 0.2 %   Neutrophils Relative % 61 %   Neutro Abs 5.8 1.7 - 7.7 K/uL   Lymphocytes Relative 31 %   Lymphs Abs 3.0 0.7 - 4.0 K/uL   Monocytes Relative 7 %   Monocytes Absolute 0.7 0.1 - 1.0 K/uL   Eosinophils Relative 1 %   Eosinophils Absolute 0.1 0.0 - 0.5 K/uL   Basophils Relative 0 %   Basophils Absolute 0.0 0.0 - 0.1 K/uL   Immature Granulocytes 0 %   Abs Immature Granulocytes 0.03 0.00 - 0.07 K/uL  Comprehensive metabolic panel  Result Value Ref Range   Sodium 140 135 - 145 mmol/L   Potassium 4.4 3.5 - 5.1 mmol/L   Chloride 109 98 - 111 mmol/L   CO2 23 22 - 32 mmol/L   Glucose, Bld 99 70 - 99 mg/dL   BUN 29 (H) 6 - 20 mg/dL   Creatinine, Ser 7.10 0.44 - 1.00 mg/dL   Calcium 9.6 8.9 - 62.6 mg/dL  Total Protein 8.0 6.5 - 8.1 g/dL   Albumin 4.7 3.5 - 5.0 g/dL   AST 34 15 - 41 U/L   ALT 27 0 - 44 U/L   Alkaline Phosphatase 112 38 - 126 U/L   Total Bilirubin 0.8 0.3 - 1.2 mg/dL   GFR, Estimated >66 >44 mL/min   Anion gap 8 5 - 15  Lipase, blood  Result Value Ref Range   Lipase 41 11 - 51 U/L  Urinalysis, Routine w reflex microscopic Urine, Clean Catch  Result Value Ref Range   Color, Urine YELLOW (A) YELLOW   APPearance CLOUDY (A) CLEAR   Specific Gravity, Urine 1.031 (H) 1.005 - 1.030   pH 6.0 5.0 - 8.0   Glucose, UA NEGATIVE NEGATIVE mg/dL   Hgb urine dipstick NEGATIVE NEGATIVE   Bilirubin Urine NEGATIVE NEGATIVE   Ketones, ur NEGATIVE NEGATIVE mg/dL   Protein, ur NEGATIVE NEGATIVE mg/dL   Nitrite NEGATIVE NEGATIVE   Leukocytes,Ua LARGE (A) NEGATIVE   RBC / HPF 0-5 0 - 5 RBC/hpf   WBC, UA 21-50 0 - 5 WBC/hpf   Bacteria, UA RARE (A) NONE SEEN   Squamous Epithelial / LPF 21-50 0 - 5   Mucus PRESENT   POC Urine Pregnancy, ED  Result Value Ref Range   Preg Test, Ur Negative Negative      Assessment & Plan:   Problem List Items Addressed This Visit       Other   Depression, recurrent (HCC)    Chronic.  Controlled.  Continue with  current medication regimen of therapeutic exercises.  Labs ordered today.  Return to clinic in 6 months for reevaluation.  Call sooner if concerns arise.        Anxiety    Chronic.  Controlled.  Continue with current medication regimen of therapeutic exercises.  Labs ordered today.  Return to clinic in 6 months for reevaluation.  Call sooner if concerns arise.       Other Visit Diagnoses     Annual physical exam    -  Primary   Health maintenance reviewed during visit today.  Labs ordered.  Vaccines up to date.  PAP up to date.   Relevant Orders   CBC with Differential/Platelet   Comprehensive metabolic panel   Lipid panel   TSH   Urinalysis, Routine w reflex microscopic   Screening for ischemic heart disease       Relevant Orders   Lipid panel   Screening for chlamydial disease       Relevant Orders   GC/Chlamydia Probe Amp   Screening for HIV (human immunodeficiency virus)       Relevant Orders   HIV Antibody (routine testing w rflx)   Encounter for hepatitis C screening test for low risk patient       Relevant Orders   Hepatitis C Antibody        Follow up plan: Return in about 6 months (around 06/24/2022) for Depression/Anxiety FU.   LABORATORY TESTING:  - Pap smear: not applicable  IMMUNIZATIONS:   - Tdap: Tetanus vaccination status reviewed: last tetanus booster within 10 years. - Influenza: Refused - Pneumovax: Not applicable - Prevnar: Not applicable - COVID: Not applicable - HPV: Not applicable - Shingrix vaccine: Not applicable  SCREENING: -Mammogram: Not applicable  - Colonoscopy: Not applicable  - Bone Density: Not applicable  -Hearing Test: Not applicable  -Spirometry: Not applicable   PATIENT COUNSELING:   Advised to take 1 mg of folate supplement  per day if capable of pregnancy.   Sexuality: Discussed sexually transmitted diseases, partner selection, use of condoms, avoidance of unintended pregnancy  and contraceptive alternatives.    Advised to avoid cigarette smoking.  I discussed with the patient that most people either abstain from alcohol or drink within safe limits (<=14/week and <=4 drinks/occasion for males, <=7/weeks and <= 3 drinks/occasion for females) and that the risk for alcohol disorders and other health effects rises proportionally with the number of drinks per week and how often a drinker exceeds daily limits.  Discussed cessation/primary prevention of drug use and availability of treatment for abuse.   Diet: Encouraged to adjust caloric intake to maintain  or achieve ideal body weight, to reduce intake of dietary saturated fat and total fat, to limit sodium intake by avoiding high sodium foods and not adding table salt, and to maintain adequate dietary potassium and calcium preferably from fresh fruits, vegetables, and low-fat dairy products.    stressed the importance of regular exercise  Injury prevention: Discussed safety belts, safety helmets, smoke detector, smoking near bedding or upholstery.   Dental health: Discussed importance of regular tooth brushing, flossing, and dental visits.    NEXT PREVENTATIVE PHYSICAL DUE IN 1 YEAR. Return in about 6 months (around 06/24/2022) for Depression/Anxiety FU.

## 2021-12-24 LAB — CBC WITH DIFFERENTIAL/PLATELET
Basophils Absolute: 0 10*3/uL (ref 0.0–0.2)
Basos: 1 %
EOS (ABSOLUTE): 0.1 10*3/uL (ref 0.0–0.4)
Eos: 2 %
Hematocrit: 39.9 % (ref 34.0–46.6)
Hemoglobin: 13.2 g/dL (ref 11.1–15.9)
Immature Grans (Abs): 0 10*3/uL (ref 0.0–0.1)
Immature Granulocytes: 0 %
Lymphocytes Absolute: 2.5 10*3/uL (ref 0.7–3.1)
Lymphs: 34 %
MCH: 29.1 pg (ref 26.6–33.0)
MCHC: 33.1 g/dL (ref 31.5–35.7)
MCV: 88 fL (ref 79–97)
Monocytes Absolute: 0.4 10*3/uL (ref 0.1–0.9)
Monocytes: 5 %
Neutrophils Absolute: 4.4 10*3/uL (ref 1.4–7.0)
Neutrophils: 58 %
Platelets: 299 10*3/uL (ref 150–450)
RBC: 4.53 x10E6/uL (ref 3.77–5.28)
RDW: 13.1 % (ref 11.7–15.4)
WBC: 7.5 10*3/uL (ref 3.4–10.8)

## 2021-12-24 LAB — LIPID PANEL
Chol/HDL Ratio: 3.9 ratio (ref 0.0–4.4)
Cholesterol, Total: 211 mg/dL — ABNORMAL HIGH (ref 100–199)
HDL: 54 mg/dL (ref >39)
LDL Chol Calc (NIH): 139 mg/dL — ABNORMAL HIGH (ref 0–99)
Triglycerides: 99 mg/dL (ref 0–149)
VLDL Cholesterol Cal: 18 mg/dL (ref 5–40)

## 2021-12-24 LAB — MICROSCOPIC EXAMINATION: Bacteria, UA: NONE SEEN

## 2021-12-24 LAB — COMPREHENSIVE METABOLIC PANEL
ALT: 39 IU/L — ABNORMAL HIGH (ref 0–32)
AST: 30 IU/L (ref 0–40)
Albumin/Globulin Ratio: 1.9 (ref 1.2–2.2)
Albumin: 4.9 g/dL (ref 4.0–5.0)
Alkaline Phosphatase: 155 IU/L — ABNORMAL HIGH (ref 44–121)
BUN/Creatinine Ratio: 22 (ref 9–23)
BUN: 15 mg/dL (ref 6–20)
Bilirubin Total: <0.2 mg/dL (ref 0.0–1.2)
CO2: 21 mmol/L (ref 20–29)
Calcium: 10.4 mg/dL — ABNORMAL HIGH (ref 8.7–10.2)
Chloride: 104 mmol/L (ref 96–106)
Creatinine, Ser: 0.69 mg/dL (ref 0.57–1.00)
Globulin, Total: 2.6 g/dL (ref 1.5–4.5)
Glucose: 83 mg/dL (ref 70–99)
Potassium: 4.2 mmol/L (ref 3.5–5.2)
Sodium: 141 mmol/L (ref 134–144)
Total Protein: 7.5 g/dL (ref 6.0–8.5)
eGFR: 127 mL/min/{1.73_m2} (ref >59)

## 2021-12-24 LAB — URINALYSIS, ROUTINE W REFLEX MICROSCOPIC
Bilirubin, UA: NEGATIVE
Glucose, UA: NEGATIVE
Ketones, UA: NEGATIVE
Nitrite, UA: NEGATIVE
Protein,UA: NEGATIVE
RBC, UA: NEGATIVE
Specific Gravity, UA: 1.025 (ref 1.005–1.030)
Urobilinogen, Ur: 0.2 mg/dL (ref 0.2–1.0)
pH, UA: 5 (ref 5.0–7.5)

## 2021-12-24 LAB — HIV ANTIBODY (ROUTINE TESTING W REFLEX): HIV Screen 4th Generation wRfx: NONREACTIVE

## 2021-12-24 LAB — HEPATITIS C ANTIBODY: Hep C Virus Ab: NONREACTIVE

## 2021-12-24 LAB — TSH: TSH: 0.881 u[IU]/mL (ref 0.450–4.500)

## 2021-12-24 NOTE — Progress Notes (Signed)
Please let patient know that her lab work looks good.  Her liver enzymes are slightly elevated.  We will recheck them at her next visit.  Cholesterol is elevated.  I recommend working on a low fat diet and exercise.  No other concerns at this time.  Follow up as discussed.

## 2021-12-25 LAB — GC/CHLAMYDIA PROBE AMP
Chlamydia trachomatis, NAA: NEGATIVE
Neisseria Gonorrhoeae by PCR: NEGATIVE

## 2022-01-01 ENCOUNTER — Ambulatory Visit: Payer: Medicaid Other | Admitting: Surgery

## 2022-01-13 ENCOUNTER — Encounter: Payer: Self-pay | Admitting: Surgery

## 2022-01-13 ENCOUNTER — Ambulatory Visit (INDEPENDENT_AMBULATORY_CARE_PROVIDER_SITE_OTHER): Payer: Medicaid Other | Admitting: Surgery

## 2022-01-13 VITALS — BP 108/66 | HR 80 | Temp 98.4°F | Ht 64.0 in | Wt 172.8 lb

## 2022-01-13 DIAGNOSIS — K8020 Calculus of gallbladder without cholecystitis without obstruction: Secondary | ICD-10-CM

## 2022-01-13 NOTE — Progress Notes (Signed)
01/13/2022  Reason for Visit: Symptomatic cholelithiasis  Requesting Provider: Talitha Givens, PA-C  History of Present Illness: Stacey Mccarty is a 22 y.o. female presenting for evaluation of symptomatic cholelithiasis.  The patient presented to the ED on 12/05/21 with right sided abdominal pain, associated with nausea and vomiting.  She had an ultrasound which showed multiple gallstones, with a 6 mm stone in the neck of the gallbladder.  Her LFTs and WBC were normal and she was able to be discharged home that same day.  She reports she recently had another episode of pain which was of less severity.  She had tried a low fat diet and this episode happened without a fatty trigger.  She had not had any episodes prior to her ED visit.  She has had two pregnancies without RUQ abdominal pain issues.  She saw her PCP recently on 12/20/21 and was referred to [place] for further evaluation.  Past Medical History: Past Medical History:  Diagnosis Date   Anemia    Obesity      Past Surgical History: History reviewed. No pertinent surgical history.  Home Medications: Prior to Admission medications   Medication Sig Start Date End Date Taking? Authorizing Provider  oxyCODONE-acetaminophen (PERCOCET/ROXICET) 5-325 MG tablet Take by mouth every 4 (four) hours as needed for severe pain.   Yes [provider]  PRENATAL VIT-FE FUMARATE-FA PO Take by mouth.   Yes [provider]    Allergies: Allergies  Allergen Reactions   Iodine Nausea And Vomiting   Latex Rash    Social History:  reports that she has never smoked. She has never used smokeless tobacco. She reports that she does not drink alcohol and does not use drugs.   Family History: Family History  Problem Relation Age of Onset   Bipolar disorder Mother    Schizophrenia Mother    Depression Mother    Crohn's disease Mother    Schizophrenia Father    Other Father    Epilepsy Sister    Autism Sister    Autism Maternal  Grandfather     Review of Systems: Review of Systems  Constitutional:  Negative for chills and fever.  HENT:  Negative for hearing loss.   Respiratory:  Negative for shortness of breath.   Cardiovascular:  Negative for chest pain.  Gastrointestinal:  Positive for abdominal pain, nausea and vomiting.  Genitourinary:  Negative for dysuria.  Musculoskeletal:  Negative for myalgias.  Skin:  Negative for rash.  Neurological:  Negative for dizziness.  Psychiatric/Behavioral:  Negative for depression.     Physical Exam BP 108/66   Pulse 80   Temp 98.4 F (36.9 C) (Oral)   Ht 5\' 4"  (1.626 m)   Wt 172 lb 12.8 oz (78.4 kg)   SpO2 99%   BMI 29.66 kg/m  CONSTITUTIONAL: No acute distress, well-nourished HEENT:  Normocephalic, atraumatic, extraocular motion intact. NECK: Trachea is midline, and there is no jugular venous distension.  RESPIRATORY:  Lungs are clear, and breath sounds are equal bilaterally. Normal respiratory effort without pathologic use of accessory muscles. CARDIOVASCULAR: Heart is regular without murmurs, gallops, or rubs. GI: The abdomen is soft, nondistended, with some discomfort to palpation in the right upper quadrant.  Negative Murphy's sign.  MUSCULOSKELETAL:  Normal muscle strength and tone in all four extremities.  No peripheral edema or cyanosis. SKIN: Skin turgor is normal. There are no pathologic skin lesions.  NEUROLOGIC:  Motor and sensation is grossly normal.  Cranial nerves are grossly intact. PSYCH:  Alert and oriented to person, place and time. Affect is normal.  Laboratory Analysis: Labs from 12/23/2021: Sodium 141, potassium 4.2, chloride 104, CO2 21, BUN 15, creatinine 0.69.  Total bilirubin less than 0.2, AST 30, ALT 39, alkaline phosphatase 155, albumin 4.9.  White blood cell count 7.5, hemoglobin 13.2, hematocrit 39.9, platelets 299.  Imaging: Ultrasound RUQ on 12/05/2021: IMPRESSION: 1. Cholelithiasis with a 6 mm stone impacted in the  gallbladder neck but no evidence of acute cholecystitis. 2. Normal common bile duct. 3. Mild fatty replacement of the liver.  Assessment and Plan: This is a 21 y.o. female with symptomatic cholelithiasis  --Discussed with the patient the findings on her ultrasound and how the gallstones contribute to episodes of biliary colic.  Discussed the rationale behind a low fat diet, although unfortunately there is no guarantee that she would not have any episodes with it.  Discussed with her potential conservative measures but also that there's no good medical way to get rid of the issue.  As such, the main recommendation would be to proceed with surgical management.  She is in agreement. --Discussed with her the role for a robotic assisted cholecystectomy and reviewed the surgery at length with her including the incisions, the risks of bleeding, infection, injury to surrounding structures, the use of ICG to better evaluate the biliary anatomy, that this would be an outpatient surgery, post-operative activity restrictions, pain control, and she's willing to proceed. --Will schedule the patient for surgery on 01/28/22.  All of her questions have been answered.  I spent 55 minutes dedicated to the care of this patient on the date of this encounter to include pre-visit review of records, face-to-face time with the patient discussing diagnosis and management, and any post-visit coordination of care.   Stacey Coard Luis Fiorella Hanahan, MD [facility]    

## 2022-01-13 NOTE — Patient Instructions (Addendum)
Our surgery scheduler Britta Mccreedy will call you within 24-48 hours to get you scheduled. If you have not heard from her after 48 hours, please call our office. Have the blue sheet available when she calls to write down important information.   If you have any concerns or questions, please feel free to call our office.   Gallbladder Eating Plan  High blood cholesterol, obesity, a sedentary lifestyle, an unhealthy diet, and diabetes are risk factors for developing gallstones. If you have a gallbladder condition, you may have trouble digesting fats and tolerating high fat intake. Eating a low-fat diet can help reduce your symptoms and may be helpful before and after having surgery to remove your gallbladder (cholecystectomy). Your health care provider may recommend that you work with a dietitian to help you reduce the amount of fat in your diet. What are tips for following this plan? General guidelines Limit your fat intake to less than 30% of your total daily calories. If you eat around 1,800 calories each day, this means eating less than 60 grams (g) of fat per day. Fat is an important part of a healthy diet. Eating a low-fat diet can make it hard to maintain a healthy body weight. Ask your dietitian how much fat, calories, and other nutrients you need each day. Eat small, frequent meals throughout the day instead of three large meals. Drink at least 8-10 cups (1.9-2.4 L) of fluid a day. Drink enough fluid to keep your urine pale yellow. If you drink alcohol: Limit how much you have to: 0-1 drink a day for women who are not pregnant. 0-2 drinks a day for men. Know how much alcohol is in a drink. In the [place] one drink equals one 12 oz bottle of beer (355 mL), one 5 oz glass of wine (148 mL), or one 1 oz glass of hard liquor (44 mL). Reading food labels  Check nutrition facts on food labels for the amount of fat per serving. Choose foods with less than 3 grams of fat per serving. Shopping Choose  nonfat and low-fat healthy foods. Look for the words "nonfat," "low-fat," or "fat-free." Avoid buying processed or prepackaged foods. Cooking Cook using low-fat methods, such as baking, broiling, grilling, or boiling. Cook with small amounts of healthy fats, such as olive oil, grapeseed oil, canola oil, avocado oil, or sunflower oil. What foods are recommended?  All fresh, frozen, or canned fruits and vegetables. Whole grains. Low-fat or nonfat (skim) milk and yogurt. Lean meat, skinless poultry, fish, eggs, and beans. Low-fat protein supplement powders or drinks. Spices and herbs. The items listed above may not be a complete list of foods and beverages you can eat and drink. Contact a dietitian for more information. What foods are not recommended? High-fat foods. These include baked goods, fast food, fatty cuts of meat, ice cream, french toast, sweet rolls, pizza, cheese bread, foods covered with butter, creamy sauces, or cheese. Fried foods. These include french fries, tempura, battered fish, breaded chicken, fried breads, and sweets. Foods that cause bloating and gas. The items listed above may not be a complete list of foods that you should avoid. Contact a dietitian for more information. Summary A low-fat diet can be helpful if you have a gallbladder condition, or before and after gallbladder surgery. Limit your fat intake to less than 30% of your total daily calories. This is about 60 g of fat if you eat 1,800 calories each day. Eat small, frequent meals throughout the day instead of three  large meals. This information is not intended to replace advice given to you by your health care provider. Make sure you discuss any questions you have with your health care provider. Document Revised: 12/07/2020 Document Reviewed: 12/07/2020 Elsevier Patient Education  2023 [employer].   Minimally Invasive Cholecystectomy Minimally invasive cholecystectomy is surgery to remove the  gallbladder. The gallbladder is a pear-shaped organ that lies beneath the liver on the right side of the body. The gallbladder stores bile, which is a fluid that helps the body digest fats. Cholecystectomy is often done to treat inflammation (irritation and swelling) of the gallbladder (cholecystitis). This condition is usually caused by a buildup of gallstones (cholelithiasis) in the gallbladder or when the fluid in the gall bladder becomes stagnant because gallstones get stuck in the ducts (tubes) and block the flow of bile. This can result in inflammation and pain. In severe cases, emergency surgery may be required. This procedure is done through small incisions in the abdomen, instead of one large incision. It is also called laparoscopic surgery. A thin scope with a camera (laparoscope) is inserted through one incision. Then surgical instruments are inserted through the other incisions. In some cases, a minimally invasive surgery may need to be changed to a surgery that is done through a larger incision. This is called open surgery. Tell a health care provider about: Any allergies you have. All medicines you are taking, including vitamins, herbs, eye drops, creams, and over-the-counter medicines. Any problems you or family members have had with anesthetic medicines. Any bleeding problems you have. Any surgeries you have had. Any medical conditions you have. Whether you are pregnant or may be pregnant. What are the risks? Generally, this is a safe procedure. However, problems may occur, including: Infection. Bleeding. Allergic reactions to medicines. Damage to nearby structures or organs. A gallstone remaining in the common bile duct. The common bile duct carries bile from the gallbladder to the small intestine. A bile leak from the liver or cystic duct after your gallbladder is removed. What happens before the procedure? When to stop eating and drinking Follow instructions from your health  care provider about what you may eat and drink before your procedure. These may include: 8 hours before the procedure Stop eating most foods. Do not eat meat, fried foods, or fatty foods. Eat only light foods, such as toast or crackers. All liquids are okay except energy drinks and alcohol. 6 hours before the procedure Stop eating. Drink only clear liquids, such as water, clear fruit juice, black coffee, plain tea, and sports drinks. Do not drink energy drinks or alcohol. 2 hours before the procedure Stop drinking all liquids. You may be allowed to take medicines with small sips of water. If you do not follow your health care provider's instructions, your procedure may be delayed or canceled. Medicines Ask your health care provider about: Changing or stopping your regular medicines. This is especially important if you are taking diabetes medicines or blood thinners. Taking medicines such as aspirin and ibuprofen. These medicines can thin your blood. Do not take these medicines unless your health care provider tells you to take them. Taking over-the-counter medicines, vitamins, herbs, and supplements. General instructions If you will be going home right after the procedure, plan to have a responsible adult: Take you home from the hospital or clinic. You will not be allowed to drive. Care for you for the time you are told. Do not use any products that contain nicotine or tobacco for at least  4 weeks before the procedure. These products include cigarettes, chewing tobacco, and vaping devices, such as e-cigarettes. If you need help quitting, ask your health care provider. Ask your health care provider: How your surgery site will be marked. What steps will be taken to help prevent infection. These may include: Removing hair at the surgery site. Washing skin with a germ-killing soap. Taking antibiotic medicine. What happens during the procedure?  An IV will be inserted into one of your  veins. You will be given one or both of the following: A medicine to help you relax (sedative). A medicine to make you fall asleep (general anesthetic). Your surgeon will make several small incisions in your abdomen. The laparoscope will be inserted through one of the small incisions. The camera on the laparoscope will send images to a monitor in the operating room. This lets your surgeon see inside your abdomen. A gas will be pumped into your abdomen. This will expand your abdomen to give the surgeon more room to perform the surgery. Other tools that are needed for the procedure will be inserted through the other incisions. The gallbladder will be removed through one of the incisions. Your common bile duct may be examined. If stones are found in the common bile duct, they may be removed. After your gallbladder has been removed, the incisions will be closed with stitches (sutures), staples, or skin glue. Your incisions will be covered with a bandage (dressing). The procedure may vary among health care providers and hospitals. What happens after the procedure? Your blood pressure, heart rate, breathing rate, and blood oxygen level will be monitored until you leave the hospital or clinic. You will be given medicines as needed to control your pain. You may have a drain placed in the incision. The drain will be removed a day or two after the procedure. Summary Minimally invasive cholecystectomy, also called laparoscopic cholecystectomy, is surgery to remove the gallbladder using small incisions. Tell your health care provider about all the medical conditions you have and all the medicines you are taking for those conditions. Before the procedure, follow instructions about when to stop eating and drinking and changing or stopping medicines. Plan to have a responsible adult care for you for the time you are told after you leave the hospital or clinic. This information is not intended to replace advice  given to you by your health care provider. Make sure you discuss any questions you have with your health care provider. Document Revised: 06/26/2020 Document Reviewed: 06/26/2020 Elsevier Patient Education  [employer].

## 2022-01-13 NOTE — H&P (View-Only) (Signed)
01/13/2022  Reason for Visit: Symptomatic cholelithiasis  Requesting Provider: Talitha Givens, PA-C  History of Present Illness: Stacey Mccarty is a 22 y.o. female presenting for evaluation of symptomatic cholelithiasis.  The patient presented to the ED on 12/05/21 with right sided abdominal pain, associated with nausea and vomiting.  She had an ultrasound which showed multiple gallstones, with a 6 mm stone in the neck of the gallbladder.  Her LFTs and WBC were normal and she was able to be discharged home that same day.  She reports she recently had another episode of pain which was of less severity.  She had tried a low fat diet and this episode happened without a fatty trigger.  She had not had any episodes prior to her ED visit.  She has had two pregnancies without RUQ abdominal pain issues.  She saw her PCP recently on 12/20/21 and was referred to [place] for further evaluation.  Past Medical History: Past Medical History:  Diagnosis Date   Anemia    Obesity      Past Surgical History: History reviewed. No pertinent surgical history.  Home Medications: Prior to Admission medications   Medication Sig Start Date End Date Taking? Authorizing Provider  oxyCODONE-acetaminophen (PERCOCET/ROXICET) 5-325 MG tablet Take by mouth every 4 (four) hours as needed for severe pain.   Yes [provider]  PRENATAL VIT-FE FUMARATE-FA PO Take by mouth.   Yes [provider]    Allergies: Allergies  Allergen Reactions   Iodine Nausea And Vomiting   Latex Rash    Social History:  reports that she has never smoked. She has never used smokeless tobacco. She reports that she does not drink alcohol and does not use drugs.   Family History: Family History  Problem Relation Age of Onset   Bipolar disorder Mother    Schizophrenia Mother    Depression Mother    Crohn's disease Mother    Schizophrenia Father    Other Father    Epilepsy Sister    Autism Sister    Autism Maternal  Grandfather     Review of Systems: Review of Systems  Constitutional:  Negative for chills and fever.  HENT:  Negative for hearing loss.   Respiratory:  Negative for shortness of breath.   Cardiovascular:  Negative for chest pain.  Gastrointestinal:  Positive for abdominal pain, nausea and vomiting.  Genitourinary:  Negative for dysuria.  Musculoskeletal:  Negative for myalgias.  Skin:  Negative for rash.  Neurological:  Negative for dizziness.  Psychiatric/Behavioral:  Negative for depression.     Physical Exam BP 108/66   Pulse 80   Temp 98.4 F (36.9 C) (Oral)   Ht 5\' 4"  (1.626 m)   Wt 172 lb 12.8 oz (78.4 kg)   SpO2 99%   BMI 29.66 kg/m  CONSTITUTIONAL: No acute distress, well-nourished HEENT:  Normocephalic, atraumatic, extraocular motion intact. NECK: Trachea is midline, and there is no jugular venous distension.  RESPIRATORY:  Lungs are clear, and breath sounds are equal bilaterally. Normal respiratory effort without pathologic use of accessory muscles. CARDIOVASCULAR: Heart is regular without murmurs, gallops, or rubs. GI: The abdomen is soft, nondistended, with some discomfort to palpation in the right upper quadrant.  Negative Murphy's sign.  MUSCULOSKELETAL:  Normal muscle strength and tone in all four extremities.  No peripheral edema or cyanosis. SKIN: Skin turgor is normal. There are no pathologic skin lesions.  NEUROLOGIC:  Motor and sensation is grossly normal.  Cranial nerves are grossly intact. PSYCH:  Alert and oriented to person, place and time. Affect is normal.  Laboratory Analysis: Labs from 12/23/2021: Sodium 141, potassium 4.2, chloride 104, CO2 21, BUN 15, creatinine 0.69.  Total bilirubin less than 0.2, AST 30, ALT 39, alkaline phosphatase 155, albumin 4.9.  White blood cell count 7.5, hemoglobin 13.2, hematocrit 39.9, platelets 299.  Imaging: Ultrasound RUQ on 12/05/2021: IMPRESSION: 1. Cholelithiasis with a 6 mm stone impacted in the  gallbladder neck but no evidence of acute cholecystitis. 2. Normal common bile duct. 3. Mild fatty replacement of the liver.  Assessment and Plan: This is a 22 y.o. female with symptomatic cholelithiasis  --Discussed with the patient the findings on her ultrasound and how the gallstones contribute to episodes of biliary colic.  Discussed the rationale behind a low fat diet, although unfortunately there is no guarantee that she would not have any episodes with it.  Discussed with her potential conservative measures but also that there's no good medical way to get rid of the issue.  As such, the main recommendation would be to proceed with surgical management.  She is in agreement. --Discussed with her the role for a robotic assisted cholecystectomy and reviewed the surgery at length with her including the incisions, the risks of bleeding, infection, injury to surrounding structures, the use of ICG to better evaluate the biliary anatomy, that this would be an outpatient surgery, post-operative activity restrictions, pain control, and she's willing to proceed. --Will schedule the patient for surgery on 01/28/22.  All of her questions have been answered.  I spent 55 minutes dedicated to the care of this patient on the date of this encounter to include pre-visit review of records, face-to-face time with the patient discussing diagnosis and management, and any post-visit coordination of care.   Howie Ill, MD [facility]

## 2022-01-14 ENCOUNTER — Telehealth: Payer: Self-pay | Admitting: Surgery

## 2022-01-14 NOTE — Telephone Encounter (Signed)
Patient calls back, she is aware of all dates regarding her surgery.   

## 2022-01-14 NOTE — Telephone Encounter (Signed)
Left message for patient to call, please inform her of the following regarding scheduled surgery with Dr. Hampton Abbot.    Pre-Admission date/time, and Surgery date at [facility].  Surgery Date: 01/28/22 Preadmission Testing Date: 01/20/22 (phone 8a-1p)  Also patient will need to call at [phone], between 1-3:00pm the day before surgery, to find out what time to arrive for surgery.

## 2022-01-20 ENCOUNTER — Encounter
Admission: RE | Admit: 2022-01-20 | Discharge: 2022-01-20 | Disposition: A | Discharge status: Home / Self Care | Payer: Medicaid Other | Source: Ambulatory Visit | Attending: Surgery | Admitting: Surgery

## 2022-01-20 DIAGNOSIS — Z01818 Encounter for other preprocedural examination: Secondary | ICD-10-CM

## 2022-01-20 HISTORY — DX: Unspecified asthma, uncomplicated: J45.909

## 2022-01-20 HISTORY — DX: Anxiety disorder, unspecified: F41.9

## 2022-01-20 HISTORY — DX: Depression, unspecified: F32.A

## 2022-01-20 HISTORY — DX: Calculus of gallbladder without cholecystitis without obstruction: K80.20

## 2022-01-20 NOTE — Patient Instructions (Signed)
Your procedure is scheduled on:01-28-22 Tuesday Report to the Registration Desk on the 1st floor of the [facility].Then proceed to the 2nd floor Surgery Desk To find out your arrival time, please call [phone] between 1PM - 3PM on:01-27-22 Wednesday If your arrival time is 6:00 am, do not arrive prior to that time as the [facility] entrance doors do not open until 6:00 am.  REMEMBER: Instructions that are not followed completely may result in serious medical risk, up to and including death; or upon the discretion of your surgeon and anesthesiologist your surgery may need to be rescheduled.  Do not eat food after midnight the night before surgery.  No gum chewing, lozengers or hard candies.  You may however, drink CLEAR liquids up to 2 hours before you are scheduled to arrive for your surgery. Do not drink anything within 2 hours of your scheduled arrival time.  Clear liquids include: - water  - apple juice without pulp - gatorade (not RED colors) - black coffee or tea (Do NOT add milk or creamers to the coffee or tea) Do NOT drink anything that is not on this list.  Do NOT take any medication the day of surgery  One week prior to surgery: Stop Anti-inflammatories (NSAIDS) such as Advil, Aleve, Ibuprofen, Motrin, Naproxen, Naprosyn and Aspirin based products such as Excedrin, Goodys Powder, BC Powder.You may however, take Tylenol/Percocet if needed for pain up until the day of surgery.  Stop ANY OVER THE COUNTER supplements/vitamins NOW (01-20-22) until after surgery.  No Alcohol for 24 hours before or after surgery.  No Smoking including e-cigarettes for 24 hours prior to surgery.  No chewable tobacco products for at least 6 hours prior to surgery.  No nicotine patches on the day of surgery.  Do not use any "recreational" drugs for at least a week prior to your surgery.  Please be advised that the combination of cocaine and anesthesia may have negative outcomes, up to and  including death. If you test positive for cocaine, your surgery will be cancelled.  On the morning of surgery brush your teeth with toothpaste and water, you may rinse your mouth with mouthwash if you wish. Do not swallow any toothpaste or mouthwash.  Use CHG Soap as directed on instruction sheet.  Do not wear jewelry, make-up, hairpins, clips or nail polish.  Do not wear lotions, powders, or perfumes.   Do not shave body from the neck down 48 hours prior to surgery just in case you cut yourself which could leave a site for infection.  Also, freshly shaved skin may become irritated if using the CHG soap.  Contact lenses, hearing aids and dentures may not be worn into surgery.  Do not bring valuables to the hospital. [facility] is not responsible for any missing/lost belongings or valuables.   Notify your doctor if there is any change in your medical condition (cold, fever, infection).  Wear comfortable clothing (specific to your surgery type) to the hospital.  After surgery, you can help prevent lung complications by doing breathing exercises.  Take deep breaths and cough every 1-2 hours. Your doctor may order a device called an Incentive Spirometer to help you take deep breaths. When coughing or sneezing, hold a pillow firmly against your incision with both hands. This is called "splinting." Doing this helps protect your incision. It also decreases belly discomfort.  If you are being admitted to the hospital overnight, leave your suitcase in the car. After surgery it may be  brought to your room.  If you are being discharged the day of surgery, you will not be allowed to drive home. You will need a responsible adult (18 years or older) to drive you home and stay with you that night.   If you are taking public transportation, you will need to have a responsible adult (18 years or older) with you. Please confirm with your physician that it is acceptable to use public transportation.    Please call the [facility] Dept. at [phone] if you have any questions about these instructions.  Surgery Visitation Policy:  Patients undergoing a surgery or procedure may have two family members or support persons with them as long as the person is not COVID-19 positive or experiencing its symptoms.   Due to an increase in RSV and influenza rates and associated hospitalizations, children ages 26 and under will not be able to visit patients in [facility]. Masks continue to be strongly recommended.

## 2022-01-28 ENCOUNTER — Encounter
Admission: RE | Disposition: A | Discharge status: Home / Self Care | Payer: Self-pay | Source: Home / Self Care | Attending: Surgery

## 2022-01-28 ENCOUNTER — Other Ambulatory Visit: Payer: Self-pay

## 2022-01-28 ENCOUNTER — Encounter: Payer: Self-pay | Admitting: Surgery

## 2022-01-28 ENCOUNTER — Ambulatory Visit: Payer: Medicaid Other

## 2022-01-28 ENCOUNTER — Ambulatory Visit
Admission: RE | Admit: 2022-01-28 | Discharge: 2022-01-28 | Disposition: A | Discharge status: Home / Self Care | Payer: Medicaid Other | Attending: Surgery | Admitting: Surgery

## 2022-01-28 DIAGNOSIS — K8020 Calculus of gallbladder without cholecystitis without obstruction: Secondary | ICD-10-CM | POA: Diagnosis not present

## 2022-01-28 DIAGNOSIS — K8064 Calculus of gallbladder and bile duct with chronic cholecystitis without obstruction: Secondary | ICD-10-CM | POA: Diagnosis not present

## 2022-01-28 DIAGNOSIS — Z01818 Encounter for other preprocedural examination: Secondary | ICD-10-CM

## 2022-01-28 DIAGNOSIS — K760 Fatty (change of) liver, not elsewhere classified: Secondary | ICD-10-CM | POA: Insufficient documentation

## 2022-01-28 LAB — POCT PREGNANCY, URINE: Preg Test, Ur: NEGATIVE

## 2022-01-28 SURGERY — CHOLECYSTECTOMY, ROBOT-ASSISTED, LAPAROSCOPIC
Anesthesia: General | Site: Abdomen

## 2022-01-28 MED ORDER — FENTANYL CITRATE (PF) 100 MCG/2ML IJ SOLN
INTRAMUSCULAR | Status: DC | PRN
  Administered 2022-01-28: 50 ug via INTRAVENOUS
  Administered 2022-01-28: 25 ug via INTRAVENOUS
  Administered 2022-01-28: 50 ug via INTRAVENOUS

## 2022-01-28 MED ORDER — FENTANYL CITRATE (PF) 100 MCG/2ML IJ SOLN
INTRAMUSCULAR | Status: AC
  Filled 2022-01-28: qty 2

## 2022-01-28 MED ORDER — PROPOFOL 10 MG/ML IV BOLUS
INTRAVENOUS | Status: AC
  Filled 2022-01-28: qty 20

## 2022-01-28 MED ORDER — ORAL CARE MOUTH RINSE: 15.0000 mL | Freq: Once | OROMUCOSAL | Status: AC

## 2022-01-28 MED ORDER — CHLORHEXIDINE GLUCONATE CLOTH 2 % EX PADS: 6.0000 | MEDICATED_PAD | Freq: Once | CUTANEOUS | Status: DC

## 2022-01-28 MED ORDER — CHLORHEXIDINE GLUCONATE 0.12 % MT SOLN
OROMUCOSAL | Status: AC
  Administered 2022-01-28: 15 mL via OROMUCOSAL
  Filled 2022-01-28: qty 15

## 2022-01-28 MED ORDER — ACETAMINOPHEN 500 MG PO TABS: 1000.0000 mg | ORAL_TABLET | ORAL | Status: AC

## 2022-01-28 MED ORDER — DEXAMETHASONE SODIUM PHOSPHATE 10 MG/ML IJ SOLN
INTRAMUSCULAR | Status: DC | PRN
  Administered 2022-01-28: 10 mg via INTRAVENOUS

## 2022-01-28 MED ORDER — MIDAZOLAM HCL 2 MG/2ML IJ SOLN
INTRAMUSCULAR | Status: AC
  Filled 2022-01-28: qty 2

## 2022-01-28 MED ORDER — FAMOTIDINE 20 MG PO TABS: 20.0000 mg | ORAL_TABLET | Freq: Once | ORAL | Status: AC

## 2022-01-28 MED ORDER — 0.9 % SODIUM CHLORIDE (POUR BTL) OPTIME
TOPICAL | Status: DC | PRN
  Administered 2022-01-28: 500 mL

## 2022-01-28 MED ORDER — OXYCODONE HCL 5 MG/5ML PO SOLN: 5.0000 mg | Freq: Once | ORAL | Status: AC | PRN

## 2022-01-28 MED ORDER — OXYCODONE HCL 5 MG PO TABS: 5.0000 mg | ORAL_TABLET | ORAL | 0 refills | Status: DC | PRN

## 2022-01-28 MED ORDER — OXYCODONE HCL 5 MG PO TABS
5.0000 mg | ORAL_TABLET | Freq: Once | ORAL | Status: AC | PRN
  Administered 2022-01-28: 5 mg via ORAL

## 2022-01-28 MED ORDER — ACETAMINOPHEN 500 MG PO TABS
ORAL_TABLET | ORAL | Status: AC
  Administered 2022-01-28: 1000 mg via ORAL
  Filled 2022-01-28: qty 2

## 2022-01-28 MED ORDER — ACETAMINOPHEN 500 MG PO TABS: 1000.0000 mg | ORAL_TABLET | Freq: Four times a day (QID) | ORAL | Status: DC | PRN

## 2022-01-28 MED ORDER — FENTANYL CITRATE (PF) 100 MCG/2ML IJ SOLN
25.0000 ug | INTRAMUSCULAR | Status: DC | PRN
  Administered 2022-01-28 (×4): 25 ug via INTRAVENOUS

## 2022-01-28 MED ORDER — CHLORHEXIDINE GLUCONATE 0.12 % MT SOLN: 15.0000 mL | Freq: Once | OROMUCOSAL | Status: AC

## 2022-01-28 MED ORDER — ROCURONIUM BROMIDE 10 MG/ML (PF) SYRINGE
PREFILLED_SYRINGE | INTRAVENOUS | Status: AC
  Filled 2022-01-28: qty 40

## 2022-01-28 MED ORDER — PROPOFOL 10 MG/ML IV BOLUS
INTRAVENOUS | Status: DC | PRN
  Administered 2022-01-28: 50 mg via INTRAVENOUS
  Administered 2022-01-28: 200 mg via INTRAVENOUS

## 2022-01-28 MED ORDER — OXYCODONE HCL 5 MG PO TABS
ORAL_TABLET | ORAL | Status: AC
  Filled 2022-01-28: qty 1

## 2022-01-28 MED ORDER — PROPOFOL 500 MG/50ML IV EMUL
INTRAVENOUS | Status: DC | PRN
  Administered 2022-01-28: 20 ug/kg/min via INTRAVENOUS

## 2022-01-28 MED ORDER — IBUPROFEN 600 MG PO TABS: 600.0000 mg | ORAL_TABLET | Freq: Three times a day (TID) | ORAL | 1 refills | Status: DC | PRN

## 2022-01-28 MED ORDER — CEFAZOLIN SODIUM-DEXTROSE 2-4 GM/100ML-% IV SOLN
INTRAVENOUS | Status: AC
  Filled 2022-01-28: qty 100

## 2022-01-28 MED ORDER — ROCURONIUM BROMIDE 10 MG/ML (PF) SYRINGE
PREFILLED_SYRINGE | INTRAVENOUS | Status: AC
  Filled 2022-01-28: qty 10

## 2022-01-28 MED ORDER — LACTATED RINGERS IV SOLN: INTRAVENOUS | Status: DC | PRN

## 2022-01-28 MED ORDER — KETOROLAC TROMETHAMINE 15 MG/ML IJ SOLN
INTRAMUSCULAR | Status: DC | PRN
  Administered 2022-01-28: 30 mg via INTRAVENOUS

## 2022-01-28 MED ORDER — KETAMINE HCL 10 MG/ML IJ SOLN
INTRAMUSCULAR | Status: DC | PRN
  Administered 2022-01-28: 20 mg via INTRAVENOUS

## 2022-01-28 MED ORDER — LACTATED RINGERS IV SOLN: INTRAVENOUS | Status: DC

## 2022-01-28 MED ORDER — ONDANSETRON HCL 4 MG/2ML IJ SOLN
INTRAMUSCULAR | Status: DC | PRN
  Administered 2022-01-28: 4 mg via INTRAVENOUS

## 2022-01-28 MED ORDER — INDOCYANINE GREEN 25 MG IV SOLR
2.5000 mg | INTRAVENOUS | Status: AC
  Administered 2022-01-28: 2.5 mg via INTRAVENOUS
  Filled 2022-01-28: qty 1

## 2022-01-28 MED ORDER — ROCURONIUM BROMIDE 10 MG/ML (PF) SYRINGE
PREFILLED_SYRINGE | INTRAVENOUS | Status: AC
  Filled 2022-01-28: qty 20

## 2022-01-28 MED ORDER — PHENYLEPHRINE 80 MCG/ML (10ML) SYRINGE FOR IV PUSH (FOR BLOOD PRESSURE SUPPORT)
PREFILLED_SYRINGE | INTRAVENOUS | Status: AC
  Filled 2022-01-28: qty 20

## 2022-01-28 MED ORDER — GABAPENTIN 300 MG PO CAPS: 300.0000 mg | ORAL_CAPSULE | ORAL | Status: AC

## 2022-01-28 MED ORDER — ROCURONIUM BROMIDE 100 MG/10ML IV SOLN
INTRAVENOUS | Status: DC | PRN
  Administered 2022-01-28: 10 mg via INTRAVENOUS
  Administered 2022-01-28: 50 mg via INTRAVENOUS

## 2022-01-28 MED ORDER — DEXMEDETOMIDINE HCL IN NACL 80 MCG/20ML IV SOLN
INTRAVENOUS | Status: DC | PRN
  Administered 2022-01-28: 4 ug via BUCCAL

## 2022-01-28 MED ORDER — KETAMINE HCL 50 MG/5ML IJ SOSY
PREFILLED_SYRINGE | INTRAMUSCULAR | Status: AC
  Filled 2022-01-28: qty 5

## 2022-01-28 MED ORDER — CEFAZOLIN SODIUM-DEXTROSE 2-4 GM/100ML-% IV SOLN
2.0000 g | INTRAVENOUS | Status: AC
  Administered 2022-01-28: 2 g via INTRAVENOUS

## 2022-01-28 MED ORDER — BUPIVACAINE-EPINEPHRINE (PF) 0.5% -1:200000 IJ SOLN
INTRAMUSCULAR | Status: AC
  Filled 2022-01-28: qty 30

## 2022-01-28 MED ORDER — GABAPENTIN 300 MG PO CAPS
ORAL_CAPSULE | ORAL | Status: AC
  Administered 2022-01-28: 300 mg via ORAL
  Filled 2022-01-28: qty 1

## 2022-01-28 MED ORDER — PHENYLEPHRINE 80 MCG/ML (10ML) SYRINGE FOR IV PUSH (FOR BLOOD PRESSURE SUPPORT)
PREFILLED_SYRINGE | INTRAVENOUS | Status: DC | PRN
  Administered 2022-01-28: 80 ug via INTRAVENOUS

## 2022-01-28 MED ORDER — BUPIVACAINE-EPINEPHRINE (PF) 0.5% -1:200000 IJ SOLN
INTRAMUSCULAR | Status: DC | PRN
  Administered 2022-01-28: 10 mL
  Administered 2022-01-28: 20 mL

## 2022-01-28 MED ORDER — FAMOTIDINE 20 MG PO TABS
ORAL_TABLET | ORAL | Status: AC
  Administered 2022-01-28: 20 mg via ORAL
  Filled 2022-01-28: qty 1

## 2022-01-28 MED ORDER — SUGAMMADEX SODIUM 200 MG/2ML IV SOLN
INTRAVENOUS | Status: DC | PRN
  Administered 2022-01-28: 200 mg via INTRAVENOUS

## 2022-01-28 MED ORDER — LIDOCAINE HCL (CARDIAC) PF 100 MG/5ML IV SOSY
PREFILLED_SYRINGE | INTRAVENOUS | Status: DC | PRN
  Administered 2022-01-28: 100 mg via INTRAVENOUS

## 2022-01-28 MED ORDER — MIDAZOLAM HCL 2 MG/2ML IJ SOLN
INTRAMUSCULAR | Status: DC | PRN
  Administered 2022-01-28: 2 mg via INTRAVENOUS

## 2022-01-28 SURGICAL SUPPLY — 54 items
ADH SKN CLS APL DERMABOND .7 (GAUZE/BANDAGES/DRESSINGS) ×2
BAG PRESSURE INF REUSE 1000 (BAG) IMPLANT
CANNULA CAP OBTURATR AIRSEAL 8 (CAP) IMPLANT
CANNULA REDUC XI 12-8 STAPL (CANNULA) ×2
CANNULA REDUCER 12-8 DVNC XI (CANNULA) ×2 IMPLANT
CLIP LIGATING HEMO O LOK GREEN (MISCELLANEOUS) ×2 IMPLANT
DERMABOND ADVANCED .7 DNX12 (GAUZE/BANDAGES/DRESSINGS) ×2 IMPLANT
DRAPE ARM DVNC X/XI (DISPOSABLE) ×8 IMPLANT
DRAPE COLUMN DVNC XI (DISPOSABLE) ×2 IMPLANT
DRAPE DA VINCI XI ARM (DISPOSABLE) ×8
DRAPE DA VINCI XI COLUMN (DISPOSABLE) ×2
ELECT CAUTERY BLADE TIP 2.5 (TIP) ×2
ELECT REM PT RETURN 9FT ADLT (ELECTROSURGICAL) ×2
ELECTRODE CAUTERY BLDE TIP 2.5 (TIP) ×2 IMPLANT
ELECTRODE REM PT RTRN 9FT ADLT (ELECTROSURGICAL) ×2 IMPLANT
GLOVE SURG SYN 7.0 (GLOVE) ×4 IMPLANT
GLOVE SURG SYN 7.0 PF PI (GLOVE) ×4 IMPLANT
GLOVE SURG SYN 7.5  E (GLOVE) ×4
GLOVE SURG SYN 7.5 E (GLOVE) ×4 IMPLANT
GLOVE SURG SYN 7.5 PF PI (GLOVE) ×4 IMPLANT
GOWN STRL REUS W/ TWL LRG LVL3 (GOWN DISPOSABLE) ×8 IMPLANT
GOWN STRL REUS W/TWL LRG LVL3 (GOWN DISPOSABLE) ×8
IRRIGATOR SUCT 8 DISP DVNC XI (IRRIGATION / IRRIGATOR) IMPLANT
IRRIGATOR SUCTION 8MM XI DISP (IRRIGATION / IRRIGATOR)
IV NS 1000ML (IV SOLUTION)
IV NS 1000ML BAXH (IV SOLUTION) IMPLANT
KIT PINK PAD W/HEAD ARE REST (MISCELLANEOUS) ×2
KIT PINK PAD W/HEAD ARM REST (MISCELLANEOUS) ×2 IMPLANT
LABEL OR SOLS (LABEL) ×2 IMPLANT
MANIFOLD NEPTUNE II (INSTRUMENTS) ×2 IMPLANT
NEEDLE HYPO 22GX1.5 SAFETY (NEEDLE) ×2 IMPLANT
NS IRRIG 500ML POUR BTL (IV SOLUTION) ×2 IMPLANT
OBTURATOR OPTICAL STANDARD 8MM (TROCAR) ×2
OBTURATOR OPTICAL STND 8 DVNC (TROCAR) ×2
OBTURATOR OPTICALSTD 8 DVNC (TROCAR) ×2 IMPLANT
PACK LAP CHOLECYSTECTOMY (MISCELLANEOUS) ×2 IMPLANT
PENCIL SMOKE EVACUATOR (MISCELLANEOUS) ×2 IMPLANT
SEAL CANN UNIV 5-8 DVNC XI (MISCELLANEOUS) ×6 IMPLANT
SEAL XI 5MM-8MM UNIVERSAL (MISCELLANEOUS) ×6
SET TUBE FILTERED XL AIRSEAL (SET/KITS/TRAYS/PACK) IMPLANT
SET TUBE SMOKE EVAC HIGH FLOW (TUBING) ×2 IMPLANT
SOLUTION ELECTROLUBE (MISCELLANEOUS) ×2 IMPLANT
SPIKE FLUID TRANSFER (MISCELLANEOUS) ×2 IMPLANT
SPONGE T-LAP 18X18 ~~LOC~~+RFID (SPONGE) IMPLANT
SPONGE T-LAP 4X18 ~~LOC~~+RFID (SPONGE) ×2 IMPLANT
STAPLER CANNULA SEAL DVNC XI (STAPLE) ×2 IMPLANT
STAPLER CANNULA SEAL XI (STAPLE) ×2
SUT MNCRL AB 4-0 PS2 18 (SUTURE) ×2 IMPLANT
SUT VIC AB 3-0 SH 27 (SUTURE) ×2
SUT VIC AB 3-0 SH 27X BRD (SUTURE) IMPLANT
SUT VICRYL 0 UR6 27IN ABS (SUTURE) ×4 IMPLANT
SYS BAG RETRIEVAL 10MM (BASKET) ×2
SYSTEM BAG RETRIEVAL 10MM (BASKET) ×2 IMPLANT
WATER STERILE IRR 500ML POUR (IV SOLUTION) ×2 IMPLANT

## 2022-01-28 NOTE — Interval H&P Note (Signed)
History and Physical Interval Note:  01/28/2022 12:28 PM  Stacey Mccarty  has presented today for surgery, with the diagnosis of symptomatic cholelithiasis.  The various methods of treatment have been discussed with the patient and family. After consideration of risks, benefits and other options for treatment, the patient has consented to  Procedure(s): XI ROBOTIC ASSISTED LAPAROSCOPIC CHOLECYSTECTOMY (N/A) [place] (ICG) (N/A) as a surgical intervention.  The patient's history has been reviewed, patient examined, no change in status, stable for surgery.  I have reviewed the patient's chart and labs.  Questions were answered to the patient's satisfaction.     [name]

## 2022-01-28 NOTE — Transfer of Care (Cosign Needed)
Immediate Anesthesia Transfer of Care Note  Patient: Stacey Mccarty  Procedure(s) Performed: XI ROBOTIC ASSISTED LAPAROSCOPIC CHOLECYSTECTOMY (Abdomen) INDOCYANINE GREEN FLUORESCENCE IMAGING (ICG)  Patient Location: PACU  Anesthesia Type:General  Level of Consciousness: drowsy  Airway & Oxygen Therapy: Patient Spontanous Breathing and Patient connected to face mask oxygen  Post-op Assessment: Report given to RN and Post -op Vital signs reviewed and stable  Post vital signs: Reviewed and stable  Last Vitals:  Vitals Value Taken Time  BP 124/64 01/28/22 1510  Temp    Pulse 82 01/28/22 1515  Resp 15 01/28/22 1515  SpO2 100 % 01/28/22 1515  Vitals shown include unvalidated device data.  Last Pain:  Vitals:   01/28/22 1115  PainSc: 0-No pain         Complications: No notable events documented.

## 2022-01-28 NOTE — Discharge Instructions (Signed)

## 2022-01-28 NOTE — Anesthesia Procedure Notes (Cosign Needed)
Procedure Name: Intubation Date/Time: 01/28/2022 1:25 PM  Performed by: Garnette Gunner, RNPre-anesthesia Checklist: Patient identified, Emergency Drugs available, Suction available and Patient being monitored Patient Re-evaluated:Patient Re-evaluated prior to induction Oxygen Delivery Method: Circle system utilized Preoxygenation: Pre-oxygenation with 100% oxygen Induction Type: IV induction Ventilation: Mask ventilation without difficulty Laryngoscope Size: Mac and 3 Grade View: Grade I Tube type: Oral Tube size: 7.0 mm Number of attempts: 1 Airway Equipment and Method: Stylet Placement Confirmation: ETT inserted through vocal cords under direct vision, positive ETCO2 and breath sounds checked- equal and bilateral Secured at: 23 cm Tube secured with: Tape Dental Injury: Teeth and Oropharynx as per pre-operative assessment  Comments: Intubated by [place] under CRNA and MDA supervision

## 2022-01-28 NOTE — Op Note (Signed)
  Procedure Date:  01/28/2022  Pre-operative Diagnosis:  Symptomatic cholelithiasis  Post-operative Diagnosis: Symptomatic cholelithiasis  Procedure:  Robotic assisted cholecystectomy with ICG FireFly cholangiogram  Surgeon:  Melvyn Neth, MD  Anesthesia:  General endotracheal  Estimated Blood Loss:  10 ml  Specimens:  gallbladder  Complications:  None  Indications for Procedure:  This is a 22 y.o. female who presents with abdominal pain and workup revealing symptomatic cholelithiasis.  The benefits, complications, treatment options, and expected outcomes were discussed with the patient. The risks of bleeding, infection, recurrence of symptoms, failure to resolve symptoms, bile duct damage, bile duct leak, retained common bile duct stone, bowel injury, and need for further procedures were all discussed with the patient and she was willing to proceed.  Description of Procedure: The patient was correctly identified in the preoperative area and brought into the operating room.  The patient was placed supine with VTE prophylaxis in place.  Appropriate time-outs were performed.  Anesthesia was induced and the patient was intubated.  Appropriate antibiotics were infused.  The abdomen was prepped and draped in a sterile fashion. An infraumbilical incision was made. A cutdown technique was used to enter the abdominal cavity without injury, and a 12 mm robotic port was inserted.  Pneumoperitoneum was obtained with appropriate opening pressures.  Three 8-mm ports were placed in the mid abdomen at the level of the umbilicus under direct visualization.  The DaVinci platform was docked, camera targeted, and instruments were placed under direct visualization.  The gallbladder was identified.  The fundus was grasped and retracted cephalad.  Adhesions were lysed bluntly and with electrocautery. The infundibulum was grasped and retracted laterally, exposing the peritoneum overlying the gallbladder.  This  was incised with electrocautery and extended on either side of the gallbladder.  FireFly cholangiogram was then obtained, and we were able to clearly identify the cystic duct and common bile duct including the bifurcation of the hepatic ducts.  The cystic duct and cystic artery were carefully dissected with combination of cautery and blunt dissection.  Both were clipped twice proximally and once distally, cutting in between.  The gallbladder was taken from the gallbladder fossa in a retrograde fashion with electrocautery. The gallbladder was placed in an Endocatch bag. The liver bed was inspected and any bleeding was controlled with electrocautery. The right upper quadrant was then inspected again revealing intact clips, no bleeding, and no ductal injury.  The area was thoroughly irrigated.  The 8 mm ports were removed under direct visualization and the 12 mm port was removed.  The Endocatch bag was brought out via the umbilical incision. The fascial opening was closed using 0 vicryl suture.  Local anesthetic was infused in all incisions and the incisions were closed with 4-0 Monocryl.  The wounds were cleaned and sealed with DermaBond.  The patient was emerged from anesthesia and extubated and brought to the recovery room for further management.  The patient tolerated the procedure well and all counts were correct at the end of the case.   Melvyn Neth, MD

## 2022-01-28 NOTE — Anesthesia Preprocedure Evaluation (Signed)
Anesthesia Evaluation  Patient identified by MRN, date of birth, ID band Patient awake    Reviewed: Allergy & Precautions, NPO status , Patient's Chart, lab work & pertinent test results  History of Anesthesia Complications Negative for: history of anesthetic complications  Airway Mallampati: III  TM Distance: >3 FB Neck ROM: full    Dental  (+) Chipped   Pulmonary neg shortness of breath, asthma    Pulmonary exam normal        Cardiovascular Exercise Tolerance: Good (-) angina (-) Past MI negative cardio ROS Normal cardiovascular exam     Neuro/Psych negative neurological ROS  negative psych ROS   GI/Hepatic negative GI ROS, Neg liver ROS,neg GERD  ,,  Endo/Other  negative endocrine ROS    Renal/GU      Musculoskeletal   Abdominal   Peds  Hematology negative hematology ROS (+)   Anesthesia Other Findings Past Medical History: No date: Anemia No date: Anxiety No date: Asthma     Comment:  as a child-no inhalers No date: Depression No date: Obesity No date: Symptomatic cholelithiasis  Past Surgical History: No date: NO PAST SURGERIES  BMI    Body Mass Index: 29.67 kg/m      Reproductive/Obstetrics negative OB ROS                             Anesthesia Physical Anesthesia Plan  ASA: 2  Anesthesia Plan: General ETT   Post-op Pain Management:    Induction: Intravenous  PONV Risk Score and Plan: Ondansetron, Dexamethasone, Midazolam and Treatment may vary due to age or medical condition  Airway Management Planned: Oral ETT  Additional Equipment:   Intra-op Plan:   Post-operative Plan: Extubation in OR  Informed Consent: I have reviewed the patients History and Physical, chart, labs and discussed the procedure including the risks, benefits and alternatives for the proposed anesthesia with the patient or authorized representative who has indicated his/her  understanding and acceptance.     Dental Advisory Given  Plan Discussed with: Anesthesiologist, CRNA and Surgeon  Anesthesia Plan Comments: (Patient consented for risks of anesthesia including but not limited to:  - adverse reactions to medications - damage to eyes, teeth, lips or other oral mucosa - nerve damage due to positioning  - sore throat or hoarseness - Damage to heart, brain, nerves, lungs, other parts of body or loss of life  Patient voiced understanding.)       Anesthesia Quick Evaluation

## 2022-01-29 NOTE — Anesthesia Postprocedure Evaluation (Signed)
Anesthesia Post Note  Patient: Nohemi Nicklaus  Procedure(s) Performed: XI ROBOTIC ASSISTED LAPAROSCOPIC CHOLECYSTECTOMY (Abdomen) INDOCYANINE GREEN FLUORESCENCE IMAGING (ICG)  Patient location during evaluation: PACU Anesthesia Type: General Level of consciousness: awake and alert Pain management: pain level controlled Vital Signs Assessment: post-procedure vital signs reviewed and stable Respiratory status: spontaneous breathing, nonlabored ventilation, respiratory function stable and patient connected to nasal cannula oxygen Cardiovascular status: blood pressure returned to baseline and stable Postop Assessment: no apparent nausea or vomiting Anesthetic complications: no   No notable events documented.   Last Vitals:  Vitals:   01/28/22 1630 01/28/22 1647  BP: 132/78 138/76  Pulse: 92 86  Resp: 17 18  Temp: 36.4 C (!) 36.2 C  SpO2: 99% 100%    Last Pain:  Vitals:   01/28/22 1647  TempSrc: Temporal  PainSc: 0-No pain                 Precious Haws Ornella Coderre

## 2022-01-30 LAB — SURGICAL PATHOLOGY

## 2022-02-11 ENCOUNTER — Encounter: Payer: Self-pay | Admitting: Physician Assistant

## 2022-02-11 ENCOUNTER — Ambulatory Visit (INDEPENDENT_AMBULATORY_CARE_PROVIDER_SITE_OTHER): Payer: Medicaid Other | Admitting: Physician Assistant

## 2022-02-11 VITALS — BP 100/69 | HR 65 | Temp 98.3°F | Ht 64.0 in | Wt 170.8 lb

## 2022-02-11 DIAGNOSIS — K8020 Calculus of gallbladder without cholecystitis without obstruction: Secondary | ICD-10-CM

## 2022-02-11 DIAGNOSIS — Z09 Encounter for follow-up examination after completed treatment for conditions other than malignant neoplasm: Secondary | ICD-10-CM

## 2022-02-11 NOTE — Patient Instructions (Signed)

## 2022-02-11 NOTE — Progress Notes (Addendum)
[facility] POST-OP OFFICE VISIT  02/11/2022  HPI: Stacey Mccarty is a 22 y.o. female 14 days s/p robotic assisted laparoscopic cholecystectomy for symptomatic cholelithiasis with Dr Kirke Corin   She is doing very well Sore for about 3 days and then got markedly better after day 4 No abdominal pain, fever, chills, nausea, emesis Tolerating PO; no issues with bowel function; no diarrhea Incisions are well healed No other complaints   Vital signs: BP 100/69   Pulse 65   Temp 98.3 F (36.8 C) (Oral)   Ht 5\' 4"  (1.626 m)   Wt 170 lb 12.8 oz (77.5 kg)   LMP 01/15/2022 (Approximate)   SpO2 98%   BMI 29.32 kg/m    Physical Exam: Constitutional: Well appearing female, NAD Abdomen: Soft, non-tender, non-distended, no rebound/guarding Skin: Laparoscopic incisions are healing well, no erythema or drainage   Assessment/Plan: This is a 22 y.o. female 14 days s/p robotic assisted laparoscopic cholecystectomy for symptomatic cholelithiasis with Dr Kirke Corin    - Pain control prn  - Reviewed wound care recommendation  - Reviewed lifting restrictions; 4 weeks total  - Reviewed surgical pathology; [place]  - She can follow up on as needed basis; She understands to call with questions/concerns  -- Edison Simon, PA-C [facility] 02/11/2022, 2:23 PM M-F: 7am - 4pm

## 2022-04-15 ENCOUNTER — Ambulatory Visit: Payer: Self-pay | Admitting: *Deleted

## 2022-04-15 NOTE — Telephone Encounter (Signed)
  Chief Complaint: midline pain with eating /drinking Symptoms: pain- intermittent, vomiting- intermittent  Frequency: over 1 week Pertinent Negatives: Patient denies back pain, diarrhea, fever, urination pain Disposition: [] ED /[] Urgent Care (no appt availability in office) / [x] Appointment(In office/virtual)/ []  [facility]/ [] Home Care/ [] Refused Recommended Disposition /[] [facility]/ []  Follow-up with PCP Additional Notes: Appointment has been scheduled - patient advised if she gets worse- be seen immediate

## 2022-04-15 NOTE — Telephone Encounter (Signed)
Reason for Disposition  [1] MODERATE pain (e.g., interferes with normal activities) AND [2] pain comes and goes (cramps) AND [3] present > 24 hours  (Exception: Pain with Vomiting or Diarrhea - see that Guideline.)  Answer Assessment - Initial Assessment Questions 1. LOCATION: "Where does it hurt?"      Midline center- upper abdomen 2. RADIATION: "Does the pain shoot anywhere else?" (e.g., chest, back)     No radiation 3. ONSET: "When did the pain begin?" (e.g., minutes, hours or days ago)      Last week- every time she eat/drinks 4. SUDDEN: "Gradual or sudden onset?"     gradual 5. PATTERN "Does the pain come and go, or is it constant?"    - If it comes and goes: "How long does it last?" "Do you have pain now?"     (Note: Comes and goes means the pain is intermittent. It goes away completely between bouts.)    - If constant: "Is it getting better, staying the same, or getting worse?"      (Note: Constant means the pain never goes away completely; most serious pain is constant and gets worse.)      Achy- pain- tapers- lasting 15-30 minutes, 1 hour 6. SEVERITY: "How bad is the pain?"  (e.g., Scale 1-10; mild, moderate, or severe)    - MILD (1-3): Doesn't interfere with normal activities, abdomen soft and not tender to touch.     - MODERATE (4-7): Interferes with normal activities or awakens from sleep, abdomen tender to touch.     - SEVERE (8-10): Excruciating pain, doubled over, unable to do any normal activities.       Severe- Sunday, yesterday- moderate 7. RECURRENT SYMPTOM: "Have you ever had this type of stomach pain before?" If Yes, ask: "When was the last time?" and "What happened that time?"      no 8. CAUSE: "What do you think is causing the stomach pain?"     Similar to gallbladder attach- patient has had gallbladder removed 9. RELIEVING/AGGRAVATING FACTORS: "What makes it better or worse?" (e.g., antacids, bending or twisting motion, bowel movement)     Eating/drinking- worse,  resting helped 10. OTHER SYMPTOMS: "Do you have any other symptoms?" (e.g., back pain, diarrhea, fever, urination pain, vomiting)       Vomiting- occurring once/3 day,  vaginal spotting 11. PREGNANCY: "Is there any chance you are pregnant?" "When was your last menstrual period?"       No- UPT negative, IUD  Protocols used: Abdominal Pain - [facility]

## 2022-04-16 ENCOUNTER — Encounter: Payer: Self-pay | Admitting: Unknown Physician Specialty

## 2022-04-16 ENCOUNTER — Ambulatory Visit: Payer: Medicaid Other | Admitting: Unknown Physician Specialty

## 2022-04-16 VITALS — BP 116/73 | HR 88 | Temp 98.3°F | Ht 64.02 in | Wt 175.7 lb

## 2022-04-16 DIAGNOSIS — K911 Postgastric surgery syndromes: Secondary | ICD-10-CM

## 2022-04-16 NOTE — Progress Notes (Signed)
BP 116/73   Pulse 88   Temp 98.3 F (36.8 C) (Oral)   Ht 5' 4.02" (1.626 m)   Wt 175 lb 11.2 oz (79.7 kg)   SpO2 99%   BMI 30.14 kg/m    Subjective:    Patient ID: Stacey Mccarty, female    DOB: 04/15/2000, 22 y.o.   MRN: [id number]  HPI: Stacey Mccarty is a 22 y.o. female  Chief Complaint  Patient presents with   Adominal pain    Happens when she eats for the past 2 weeks, states that pain is getting worse. Pain seems to be in the area where her gallbladder was.    Abdominal Pain This is a new problem. Episode onset: for 2 weeks.  Everytime she eats a full meal such as an entree and 2 sides.  Gallbladder removed several months ago. The onset quality is gradual. Episode frequency: starts almost immediately after a meal and lasts for 1 1/2 hours.  Worse last Sunday after a high fat meal. The problem has been waxing and waning since onset. The pain is located in the RUQ. The pain is moderate. The quality of the pain is described as cramping and aching. The pain radiates to the LLQ. Associated symptoms include headaches, nausea and vomiting. Pertinent negatives include no anorexia, anxiety, arthralgias, belching, constipation, diarrhea, dysuria, fever, flatus, hematuria or sore throat. The symptoms are relieved by being still. Past treatments include nothing. The treatment provided no improvement relief. Significant past medical history includes abdominal surgery. PMH comments: Gall bladder our 86months ago     Relevant past medical, surgical, family and social history reviewed and updated as indicated. Interim medical history since our last visit reviewed. Allergies and medications reviewed and updated.  Review of Systems  Constitutional:  Negative for fever.  HENT:  Negative for sore throat.   Gastrointestinal:  Positive for abdominal pain, nausea and vomiting. Negative for anorexia, constipation, diarrhea and flatus.  Genitourinary:  Negative for dysuria and hematuria.   Musculoskeletal:  Negative for arthralgias.  Neurological:  Positive for headaches.  Psychiatric/Behavioral:  The patient is not nervous/anxious.     Per HPI unless specifically indicated above     Objective:    BP 116/73   Pulse 88   Temp 98.3 F (36.8 C) (Oral)   Ht 5' 4.02" (1.626 m)   Wt 175 lb 11.2 oz (79.7 kg)   SpO2 99%   BMI 30.14 kg/m   Wt Readings from Last 3 Encounters:  04/16/22 175 lb 11.2 oz (79.7 kg)  02/11/22 170 lb 12.8 oz (77.5 kg)  01/28/22 172 lb 13.5 oz (78.4 kg)    Physical Exam Constitutional:      General: She is not in acute distress.    Appearance: Normal appearance. She is well-developed.  HENT:     Head: Normocephalic and atraumatic.  Eyes:     General: Lids are normal. No scleral icterus.       Right eye: No discharge.        Left eye: No discharge.     Conjunctiva/sclera: Conjunctivae normal.  Neck:     Vascular: No carotid bruit or JVD.  Cardiovascular:     Rate and Rhythm: Normal rate and regular rhythm.     Heart sounds: Normal heart sounds.  Pulmonary:     Effort: Pulmonary effort is normal. No respiratory distress.     Breath sounds: Normal breath sounds.  Abdominal:     Palpations: There is no hepatomegaly or splenomegaly.  Musculoskeletal:        General: Normal range of motion.     Cervical back: Normal range of motion and neck supple.  Skin:    General: Skin is warm and dry.     Coloration: Skin is not pale.     Findings: No rash.  Neurological:     Mental Status: She is alert and oriented to person, place, and time.  Psychiatric:        Behavior: Behavior normal.        Thought Content: Thought content normal.        Judgment: Judgment normal.     Results for orders placed or performed during the hospital encounter of 01/28/22  Pregnancy, urine POC  Result Value Ref Range   Preg Test, Ur NEGATIVE NEGATIVE  Surgical pathology  Result Value Ref Range   SURGICAL PATHOLOGY      SURGICAL PATHOLOGY CASE:  [id number] PATIENT: Fredderick Severance Surgical Pathology Report     Specimen Submitted: A. Gallbladder  Clinical History: Symptomatic cholelithiasis      DIAGNOSIS: A.  GALLBLADDER; CHOLECYSTECTOMY: - CHRONIC CHOLECYSTITIS. - CHOLELITHIASIS. - CHOLESTEROLOSIS. - BENIGN LYMPH NODE PRESENT.  GROSS DESCRIPTION: A. Labeled: Gallbladder Received: Fresh Collection time: 2:28 PM on 01/28/2022 Placed into formalin time: 3:28 PM on 01/28/2022 Size of specimen: 11 x 2.9 x 2.8 cm Specimen integrity: Intact External surface: Pink to green and glistening with a roughened hepatic surface Wall thickness: 0.2 cm Mucosa: Green and velvety with slight cholesterolosis and areas of trabeculations Cystic duct: The cystic duct is 1.0 x 0.6 x 0.1 cm and patent.  A 1.0 x 0.6 x 0.3 cm pink candidate lymph node is identified adjacent to the cystic duct Bile present: Yes, green and viscous Stones present: Yes, multiple yellow stones are present within  the lumen, 4.5 x 1.2 x 0.8 cm in aggregate Other findings: None noted  Block summary: 1 -cystic duct margin, en face and inked black with representative wall and 1 intact candidate node  CM 01/29/2022  Final Diagnosis performed by Alcario Drought, MD.   Electronically signed 01/30/2022 11:14:04AM The electronic signature indicates that the named Attending Pathologist has evaluated the specimen Technical component performed at [facility], [address], [place], [place] [address] Lab: [phone] Dir: Jolene Schimke, MD, MMM  Professional component performed at [facility], [facility], [address] [place], [place], [place] [address] Lab: [phone] Dir: Beryle Quant, MD       Assessment & Plan:   Problem List Items Addressed This Visit   None Visit Diagnoses     Dumping syndrome    -  Primary   Secondary to GB removal.  Discussed ox bile supplements.  Products recommended.  Will have to adjust dose as needed        Follow  up plan: Return if symptoms worsen or fail to improve.

## 2022-04-16 NOTE — Patient Instructions (Addendum)
Ox bile salts - Mediherb,  doublewood,  Digestymes or LVGB - [employer]

## 2022-06-10 ENCOUNTER — Telehealth: Payer: Self-pay | Admitting: Nurse Practitioner

## 2022-06-10 DIAGNOSIS — F4310 Post-traumatic stress disorder, unspecified: Secondary | ICD-10-CM

## 2022-06-10 DIAGNOSIS — J45909 Unspecified asthma, uncomplicated: Secondary | ICD-10-CM

## 2022-06-10 NOTE — Telephone Encounter (Unsigned)
Copied from CRM [id number]. Topic: General - Other >> Jun 10, 2022 11:12 AM Stacey Mccarty wrote: Reason for CRM: pt is needing a breathing test due to childhood asthmanas she is trying to into the [employer]. She also needs her notes from her last cpe. Please f/u with pt for further instructions

## 2022-06-11 NOTE — Telephone Encounter (Signed)
Called and LVM asking for patient to please return my call.   OK for PEC to speak to patient and find out more information from the patient. What kind of breathing test is she needing? Does she have paperwork regarding this?

## 2022-06-12 NOTE — Telephone Encounter (Signed)
Called and LVM asking for patient to please return my call.   OK for PEC to speak to patient and find out more information from the patient. What kind of breathing test is she needing? Does she have paperwork regarding this? 

## 2022-06-13 NOTE — Telephone Encounter (Signed)
Referral placed for psych and pulmonology for PFTs.

## 2022-06-13 NOTE — Addendum Note (Signed)
Addended by: Larae Grooms on: 06/13/2022 01:57 PM   Modules accepted: Orders

## 2022-06-13 NOTE — Telephone Encounter (Signed)
She stated she does not have paperwork for it. In recruitment for [employer] as a child, she was diagnosed with asthma. She needs to prove the strength of her lungs and that she doesn't have asthma. Possibly a pulmonary function test.  She stated she also needs a referral for a psychiatric evaluation. She was diagnosed as a child with PTSD.She needs to prove she can handle it on her own.  Still need notes from the last CPE. Printed copy if possible.  Please advise.

## 2022-06-16 NOTE — Telephone Encounter (Signed)
Called and notified patient that referrals have been entered for her. Also printed a copy of her CPE notes as requested and placed them up front to be picked up. Patient aware of this as well.

## 2022-06-19 ENCOUNTER — Telehealth: Payer: Self-pay | Admitting: Nurse Practitioner

## 2022-06-19 NOTE — Telephone Encounter (Signed)
Stacey Mccarty can we look into having the patient referral sent elsewhere please?

## 2022-06-19 NOTE — Telephone Encounter (Signed)
Copied from CRM [id number]. Topic: Referral - Request for Referral >> Jun 19, 2022  8:32 AM Marlow Baars wrote: Has patient seen PCP for this complaint? Yes.   Referral for which specialty: Psych/behavorial Preferred provider/office: Behavorial health for psych evaluation that takes her [facility] Reason for referral: Patient called in stating she needs this referral for psych evaluation for the [facility] does not take her insurance and that is why she is requesting a new referral

## 2022-06-25 NOTE — Telephone Encounter (Signed)
Called and LVM letting patient know that her referral has been sent elsewhere.  

## 2022-06-26 ENCOUNTER — Encounter: Payer: Self-pay | Admitting: Nurse Practitioner

## 2022-06-26 ENCOUNTER — Ambulatory Visit: Payer: Medicaid Other | Admitting: Nurse Practitioner

## 2022-06-26 ENCOUNTER — Ambulatory Visit (INDEPENDENT_AMBULATORY_CARE_PROVIDER_SITE_OTHER): Payer: Medicaid Other | Admitting: Nurse Practitioner

## 2022-06-26 VITALS — BP 126/76 | HR 84 | Temp 98.4°F

## 2022-06-26 DIAGNOSIS — F339 Major depressive disorder, recurrent, unspecified: Secondary | ICD-10-CM

## 2022-06-26 DIAGNOSIS — J45909 Unspecified asthma, uncomplicated: Secondary | ICD-10-CM | POA: Diagnosis not present

## 2022-06-26 MED ORDER — ALBUTEROL SULFATE (2.5 MG/3ML) 0.083% IN NEBU
2.5000 mg | INHALATION_SOLUTION | Freq: Once | RESPIRATORY_TRACT | Status: AC
  Administered 2022-06-26: 2.5 mg via RESPIRATORY_TRACT

## 2022-06-26 NOTE — Assessment & Plan Note (Signed)
Doing well.  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  Referral coordinator is working to get patient in.

## 2022-06-26 NOTE — Progress Notes (Signed)
Results discussed with patient during visit.

## 2022-06-26 NOTE — Progress Notes (Signed)
BP 126/76   Pulse 84   Temp 98.4 F (36.9 C) (Oral)   LMP  (LMP Unknown)   SpO2 97%    Subjective:    Patient ID: Stacey Mccarty, female    DOB: Jan 27, 2000, 22 y.o.   MRN: [id number]  HPI: Stacey Mccarty is a 22 y.o. female  Chief Complaint  Patient presents with   Asthma   Patient is here today because she was diagnosed with asthma as a child and needs proof that she no longer has it with a breathing test prior to going into the [employer].   Denies HA, CP, SOB, dizziness, palpitations, visual changes, and lower extremity swelling.     Relevant past medical, surgical, family and social history reviewed and updated as indicated. Interim medical history since our last visit reviewed. Allergies and medications reviewed and updated.  Review of Systems  Eyes:  Negative for visual disturbance.  Respiratory:  Negative for cough, chest tightness and shortness of breath.   Cardiovascular:  Negative for chest pain, palpitations and leg swelling.  Neurological:  Negative for dizziness and headaches.    Per HPI unless specifically indicated above     Objective:    BP 126/76   Pulse 84   Temp 98.4 F (36.9 C) (Oral)   LMP  (LMP Unknown)   SpO2 97%   Wt Readings from Last 3 Encounters:  04/16/22 175 lb 11.2 oz (79.7 kg)  02/11/22 170 lb 12.8 oz (77.5 kg)  01/28/22 172 lb 13.5 oz (78.4 kg)    Physical Exam Vitals and nursing note reviewed.  Constitutional:      General: She is not in acute distress.    Appearance: Normal appearance. She is normal weight. She is not ill-appearing, toxic-appearing or diaphoretic.  HENT:     Head: Normocephalic.     Right Ear: External ear normal.     Left Ear: External ear normal.     Nose: Nose normal.     Mouth/Throat:     Mouth: Mucous membranes are moist.     Pharynx: Oropharynx is clear.  Eyes:     General:        Right eye: No discharge.        Left eye: No discharge.     Extraocular Movements: Extraocular movements intact.      Conjunctiva/sclera: Conjunctivae normal.     Pupils: Pupils are equal, round, and reactive to light.  Cardiovascular:     Rate and Rhythm: Normal rate and regular rhythm.     Heart sounds: No murmur heard. Pulmonary:     Effort: Pulmonary effort is normal. No respiratory distress.     Breath sounds: Normal breath sounds. No wheezing or rales.  Musculoskeletal:     Cervical back: Normal range of motion and neck supple.  Skin:    General: Skin is warm and dry.     Capillary Refill: Capillary refill takes less than 2 seconds.  Neurological:     General: No focal deficit present.     Mental Status: She is alert and oriented to person, place, and time. Mental status is at baseline.  Psychiatric:        Mood and Affect: Mood normal.        Behavior: Behavior normal.        Thought Content: Thought content normal.        Judgment: Judgment normal.     Results for orders placed or performed during the hospital encounter of 01/28/22  Pregnancy, urine POC  Result Value Ref Range   Preg Test, Ur NEGATIVE NEGATIVE  Surgical pathology  Result Value Ref Range   SURGICAL PATHOLOGY      SURGICAL PATHOLOGY CASE: [id number] PATIENT: Fredderick Severance Surgical Pathology Report     Specimen Submitted: A. Gallbladder  Clinical History: Symptomatic cholelithiasis      DIAGNOSIS: A.  GALLBLADDER; CHOLECYSTECTOMY: - CHRONIC CHOLECYSTITIS. - CHOLELITHIASIS. - CHOLESTEROLOSIS. - BENIGN LYMPH NODE PRESENT.  GROSS DESCRIPTION: A. Labeled: Gallbladder Received: Fresh Collection time: 2:28 PM on 01/28/2022 Placed into formalin time: 3:28 PM on 01/28/2022 Size of specimen: 11 x 2.9 x 2.8 cm Specimen integrity: Intact External surface: Pink to green and glistening with a roughened hepatic surface Wall thickness: 0.2 cm Mucosa: Green and velvety with slight cholesterolosis and areas of trabeculations Cystic duct: The cystic duct is 1.0 x 0.6 x 0.1 cm and patent.  A 1.0 x 0.6 x 0.3 cm  pink candidate lymph node is identified adjacent to the cystic duct Bile present: Yes, green and viscous Stones present: Yes, multiple yellow stones are present within  the lumen, 4.5 x 1.2 x 0.8 cm in aggregate Other findings: None noted  Block summary: 1 -cystic duct margin, en face and inked black with representative wall and 1 intact candidate node  CM 01/29/2022  Final Diagnosis performed by Alcario Drought, MD.   Electronically signed 01/30/2022 11:14:04AM The electronic signature indicates that the named Attending Pathologist has evaluated the specimen Technical component performed at [facility], [address], [place], [place] [address] Lab: [phone] Dir: Jolene Schimke, MD, MMM  Professional component performed at [facility], [facility], [address] [place], [place], [place] [address] Lab: [phone] Dir: Beryle Quant, MD       Assessment & Plan:   Problem List Items Addressed This Visit       Other   Depression, recurrent [facility]) - Primary    Doing well.  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  Referral coordinator is working to get patient in.      Other Visit Diagnoses     Mild asthma, unspecified whether complicated, unspecified whether persistent       Childhood asthma. Military is requiring testing to say her symptoms have resolved. Spirometry done today. Letter written for [employer] during visit.   Relevant Orders   Spirometry with graph (Completed)        Follow up plan: Return if symptoms worsen or fail to improve.

## 2022-08-01 ENCOUNTER — Ambulatory Visit
Admission: RE | Admit: 2022-08-01 | Discharge: 2022-08-01 | Disposition: A | Discharge status: Home / Self Care | Payer: MEDICAID | Source: Ambulatory Visit | Attending: Emergency Medicine

## 2022-08-01 VITALS — BP 130/80 | HR 88 | Temp 98.6°F | Resp 18

## 2022-08-01 DIAGNOSIS — R112 Nausea with vomiting, unspecified: Secondary | ICD-10-CM

## 2022-08-01 DIAGNOSIS — H1032 Unspecified acute conjunctivitis, left eye: Secondary | ICD-10-CM

## 2022-08-01 DIAGNOSIS — B349 Viral infection, unspecified: Secondary | ICD-10-CM

## 2022-08-01 LAB — POCT RAPID STREP A (OFFICE): Rapid Strep A Screen: NEGATIVE

## 2022-08-01 MED ORDER — ONDANSETRON 4 MG PO TBDP: 4.0000 mg | ORAL_TABLET | Freq: Three times a day (TID) | ORAL | 0 refills | Status: AC | PRN

## 2022-08-01 MED ORDER — POLYMYXIN B-TRIMETHOPRIM 10000-0.1 UNIT/ML-% OP SOLN: 1.0000 [drp] | Freq: Four times a day (QID) | OPHTHALMIC | 0 refills | Status: AC

## 2022-08-01 NOTE — ED Provider Notes (Signed)
Renaldo Fiddler    CSN: [id number] Arrival date & time: 08/01/22  0946      History   Chief Complaint Chief Complaint  Patient presents with   Headache    Sore throat, nose bleed, migraines, runny nose, cough, dizziness, - Entered by patient    HPI Stacey Mccarty is a 22 y.o. female. Patient presents with 3 day history of fever, headache, congestion, cough, nausea, vomiting.  Tmax 104.  Ibuprofen taken this morning.  2 episodes of emesis today; no diarrhea.  She also reports redness, itching, and yellowish-green eye drainage from left eye since this morning; her child has pink eye.  No eye injury, eye pain, change in vision.  She denies rash, shortness of breath, abdominal pain, or other symptoms.  Negative COVID tests at home x 2.    The history is provided by the patient and medical records.    Past Medical History:  Diagnosis Date   Anemia    Anxiety    Asthma    as a child-no inhalers   Depression    Obesity    Symptomatic cholelithiasis     Patient Active Problem List   Diagnosis Date Noted   Symptomatic cholelithiasis 01/28/2022   Biliary colic 12/23/2021   Depression, recurrent (HCC) 05/08/2021   Anxiety 05/08/2021    Past Surgical History:  Procedure Laterality Date   CHOLECYSTECTOMY     NO PAST SURGERIES      OB History     Gravida  2   Para  2   Term  2   Preterm  0   AB  0   Living  2      SAB  0   IAB  0   Ectopic  0   Multiple  0   Live Births  2            Home Medications    Prior to Admission medications   Medication Sig Start Date End Date Taking? Authorizing Provider  ondansetron (ZOFRAN-ODT) 4 MG disintegrating tablet Take 1 tablet (4 mg total) by mouth every 8 (eight) hours as needed for nausea or vomiting. 08/01/22  Yes Mickie Bail, NP  trimethoprim-polymyxin b (POLYTRIM) ophthalmic solution Place 1 drop into both eyes 4 (four) times daily for 7 days. 08/01/22 08/08/22 Yes Mickie Bail, NP  acetaminophen  (TYLENOL) 500 MG tablet Take 2 tablets (1,000 mg total) by mouth every 6 (six) hours as needed for mild pain. 01/28/22   Henrene Dodge, MD  PARAGARD INTRAUTERINE COPPER IU 1 Intra Uterine Device by Intrauterine route once.    [provider]    Family History Family History  Problem Relation Age of Onset   Bipolar disorder Mother    Schizophrenia Mother    Depression Mother    Crohn's disease Mother    Schizophrenia Father    Other Father    Epilepsy Sister    Autism Sister    Autism Maternal Grandfather     Social History Social History   Tobacco Use   Smoking status: Never   Smokeless tobacco: Never  Vaping Use   Vaping status: Never Used  Substance Use Topics   Alcohol use: Yes    Comment: rare   Drug use: Never     Allergies   Iodine and Latex   Review of Systems Review of Systems  Constitutional:  Positive for fever. Negative for chills.  HENT:  Positive for congestion and sore throat. Negative for ear  pain.   Eyes:  Positive for discharge, redness and itching. Negative for pain and visual disturbance.  Respiratory:  Positive for cough. Negative for shortness of breath.   Gastrointestinal:  Positive for nausea and vomiting. Negative for abdominal pain and diarrhea.  Neurological:  Positive for headaches.     Physical Exam Triage Vital Signs ED Triage Vitals [08/01/22 1002]  Encounter Vitals Group     BP      Systolic BP Percentile      Diastolic BP Percentile      Pulse Rate 88     Resp 18     Temp 98.6 F (37 C)     Temp src      SpO2 97 %     Weight      Height      Head Circumference      Peak Flow      Pain Score      Pain Loc      Pain Education      Exclude from Growth Chart    No data found.  Updated Vital Signs BP 130/80   Pulse 88   Temp 98.6 F (37 C)   Resp 18   SpO2 97%   Breastfeeding No   Visual Acuity Right Eye Distance:   Left Eye Distance:   Bilateral Distance:    Right Eye Near:   Left Eye Near:     Bilateral Near:     Physical Exam Constitutional:      General: She is not in acute distress. HENT:     Right Ear: Tympanic membrane normal.     Left Ear: Tympanic membrane normal.     Nose: Nose normal.     Mouth/Throat:     Mouth: Mucous membranes are moist.     Pharynx: Posterior oropharyngeal erythema present.  Eyes:     General: Lids are normal. Vision grossly intact.        Right eye: No discharge.     Extraocular Movements: Extraocular movements intact.     Conjunctiva/sclera:     Left eye: Left conjunctiva is injected.     Pupils: Pupils are equal, round, and reactive to light.  Cardiovascular:     Rate and Rhythm: Normal rate and regular rhythm.     Heart sounds: Normal heart sounds.  Pulmonary:     Effort: Pulmonary effort is normal. No respiratory distress.     Breath sounds: Normal breath sounds.  Abdominal:     General: Bowel sounds are normal.     Palpations: Abdomen is soft.     Tenderness: There is no abdominal tenderness. There is no guarding or rebound.  Skin:    General: Skin is warm and dry.  Neurological:     Mental Status: She is alert.      UC Treatments / Results  Labs (all labs ordered are listed, but only abnormal results are displayed) Labs Reviewed  POCT RAPID STREP A (OFFICE)    EKG   Radiology No results found.  Procedures Procedures (including critical care time)  Medications Ordered in UC Medications - No data to display  Initial Impression / Assessment and Plan / UC Course  I have reviewed the triage vital signs and the nursing notes.  Pertinent labs & imaging results that were available during my care of the patient were reviewed by me and considered in my medical decision making (see chart for details).    Nausea and vomiting, viral illness, bacterial conjunctivitis.  VSS.  Rapid strep negative.  Treating nausea and vomiting with Zofran and clear liquid diet.  ED precautions discussed.  Treating conjunctivitis with  Polytrim eyedrops.  Education provided on nausea and vomiting, viral illness, bacterial conjunctivitis. Instructed patient to follow up with her PCP if her symptoms are not improving.  She agrees to plan of care.    Final Clinical Impressions(s) / UC Diagnoses   Final diagnoses:  Nausea and vomiting, unspecified vomiting type  Viral illness  Acute bacterial conjunctivitis of left eye     Discharge Instructions      Use the antibiotic eyedrops as prescribed.    Take the antinausea medication as directed.    Keep yourself hydrated with clear liquids, such as water.   Go to the emergency department if you have worsening symptoms.    Follow up with your primary care provider.         ED Prescriptions     Medication Sig Dispense Auth. Provider   trimethoprim-polymyxin b (POLYTRIM) ophthalmic solution Place 1 drop into both eyes 4 (four) times daily for 7 days. 10 mL Mickie Bail, NP   ondansetron (ZOFRAN-ODT) 4 MG disintegrating tablet Take 1 tablet (4 mg total) by mouth every 8 (eight) hours as needed for nausea or vomiting. 20 tablet Mickie Bail, NP      PDMP not reviewed this encounter.   Mickie Bail, NP 08/01/22 [phone]

## 2022-08-01 NOTE — Discharge Instructions (Addendum)
Use the antibiotic eyedrops as prescribed.    Take the antinausea medication as directed.    Keep yourself hydrated with clear liquids, such as water.   Go to the emergency department if you have worsening symptoms.    Follow up with your primary care provider.

## 2022-08-01 NOTE — ED Triage Notes (Signed)
Patient to Urgent Care with complaints of headaches/ migraines/ nasal congestion and productive cough. Fevers. Reports nausea and vomiting- able to keep down protein drinks and fluids.  Reports symptoms started July 13th, improved July 20th then worsened Tuesday. Exposed to pink eye/ now having eye drainage that started this morning.   Two negative home Covid tests.

## 2022-12-05 ENCOUNTER — Ambulatory Visit
Admission: EM | Admit: 2022-12-05 | Discharge: 2022-12-05 | Disposition: A | Discharge status: Home / Self Care | Payer: MEDICAID | Attending: Emergency Medicine | Admitting: Emergency Medicine

## 2022-12-05 DIAGNOSIS — B349 Viral infection, unspecified: Secondary | ICD-10-CM

## 2022-12-05 DIAGNOSIS — J029 Acute pharyngitis, unspecified: Secondary | ICD-10-CM

## 2022-12-05 LAB — POC COVID19/FLU A&B COMBO
Covid Antigen, POC: NEGATIVE
Influenza A Antigen, POC: NEGATIVE
Influenza B Antigen, POC: NEGATIVE

## 2022-12-05 LAB — POCT RAPID STREP A (OFFICE): Rapid Strep A Screen: NEGATIVE

## 2022-12-05 NOTE — Discharge Instructions (Addendum)
The strep test is negative.    We will call you with the result of the flu and COVID tests.    Take Tylenol or ibuprofen as needed for fever or discomfort.  Take plain Mucinex as needed for congestion.  Rest and keep yourself hydrated.    Follow-up with your primary care provider if your symptoms are not improving.

## 2022-12-05 NOTE — ED Provider Notes (Signed)
Renaldo Fiddler    CSN: [id number] Arrival date & time: 12/05/22  1217      History   Chief Complaint Chief Complaint  Patient presents with   Fever   Sore Throat    HPI Stacey Mccarty is a 22 y.o. female.  Patient presents with fever, sore throat, runny nose, mild cough today.  She reports Tmax 103.  Treating with Tylenol and ibuprofen.  No shortness of breath.  Her medical history includes asthma as a child, anemia, anxiety, depression.  The history is provided by the patient and medical records.    Past Medical History:  Diagnosis Date   Anemia    Anxiety    Asthma    as a child-no inhalers   Depression    Obesity    Symptomatic cholelithiasis     Patient Active Problem List   Diagnosis Date Noted   Symptomatic cholelithiasis 01/28/2022   Biliary colic 12/23/2021   Depression, recurrent (HCC) 05/08/2021   Anxiety 05/08/2021    Past Surgical History:  Procedure Laterality Date   CHOLECYSTECTOMY     NO PAST SURGERIES      OB History     Gravida  2   Para  2   Term  2   Preterm  0   AB  0   Living  2      SAB  0   IAB  0   Ectopic  0   Multiple  0   Live Births  2            Home Medications    Prior to Admission medications   Medication Sig Start Date End Date Taking? Authorizing Provider  acetaminophen (TYLENOL) 500 MG tablet Take 2 tablets (1,000 mg total) by mouth every 6 (six) hours as needed for mild pain. 01/28/22  Yes Piscoya, Elita Quick, MD  PARAGARD INTRAUTERINE COPPER IU 1 Intra Uterine Device by Intrauterine route once.   Yes [provider]  ondansetron (ZOFRAN-ODT) 4 MG disintegrating tablet Take 1 tablet (4 mg total) by mouth every 8 (eight) hours as needed for nausea or vomiting. 08/01/22   Mickie Bail, NP    Family History Family History  Problem Relation Age of Onset   Bipolar disorder Mother    Schizophrenia Mother    Depression Mother    Crohn's disease Mother    Schizophrenia Father    Other  Father    Epilepsy Sister    Autism Sister    Autism Maternal Grandfather     Social History Social History   Tobacco Use   Smoking status: Never   Smokeless tobacco: Never  Vaping Use   Vaping status: Never Used  Substance Use Topics   Alcohol use: Yes    Comment: rare   Drug use: Never     Allergies   Iodine and Latex   Review of Systems Review of Systems  Constitutional:  Positive for chills and fever.  HENT:  Positive for rhinorrhea and sore throat. Negative for ear pain.   Respiratory:  Positive for cough. Negative for shortness of breath.   Cardiovascular:  Negative for chest pain and palpitations.     Physical Exam Triage Vital Signs ED Triage Vitals [12/05/22 1401]  Encounter Vitals Group     BP      Systolic BP Percentile      Diastolic BP Percentile      Pulse      Resp      Temp  Temp src      SpO2      Weight      Height      Head Circumference      Peak Flow      Pain Score 0     Pain Loc      Pain Education      Exclude from Growth Chart    No data found.  Updated Vital Signs BP 136/82 (BP Location: Left Arm)   Pulse 90   Temp 99.1 F (37.3 C) (Oral)   Resp 18   LMP  (Approximate) Comment: 11/03/2022  SpO2 98%   Visual Acuity Right Eye Distance:   Left Eye Distance:   Bilateral Distance:    Right Eye Near:   Left Eye Near:    Bilateral Near:     Physical Exam Constitutional:      General: She is not in acute distress. HENT:     Right Ear: Tympanic membrane normal.     Left Ear: Tympanic membrane normal.     Nose: Nose normal.     Mouth/Throat:     Mouth: Mucous membranes are moist.     Pharynx: Oropharynx is clear.  Cardiovascular:     Rate and Rhythm: Normal rate and regular rhythm.     Heart sounds: Normal heart sounds.  Pulmonary:     Effort: Pulmonary effort is normal. No respiratory distress.     Breath sounds: Normal breath sounds.  Skin:    General: Skin is warm and dry.  Neurological:     Mental  Status: She is alert.      UC Treatments / Results  Labs (all labs ordered are listed, but only abnormal results are displayed) Labs Reviewed  POC COVID19/FLU A&B COMBO  POCT RAPID STREP A (OFFICE)    EKG   Radiology No results found.  Procedures Procedures (including critical care time)  Medications Ordered in UC Medications - No data to display  Initial Impression / Assessment and Plan / UC Course  I have reviewed the triage vital signs and the nursing notes.  Pertinent labs & imaging results that were available during my care of the patient were reviewed by me and considered in my medical decision making (see chart for details).    Viral pharyngitis, viral illness.  Afebrile and vital signs are stable.  Rapid strep negative.  Rapid COVID and flu negative.  Discussed symptomatic treatment including Tylenol or ibuprofen as needed for fever or discomfort, plain Mucinex as needed for congestion, rest, hydration.  Instructed patient to follow-up with PCP if not improving.  ED precautions given.  Patient agrees to plan of care.   Final Clinical Impressions(s) / UC Diagnoses   Final diagnoses:  Viral pharyngitis  Viral illness     Discharge Instructions      The strep test is negative.    We will call you with the result of the flu and COVID tests.    Take Tylenol or ibuprofen as needed for fever or discomfort.  Take plain Mucinex as needed for congestion.  Rest and keep yourself hydrated.    Follow-up with your primary care provider if your symptoms are not improving.         ED Prescriptions   None    PDMP not reviewed this encounter.   Mickie Bail, NP 12/05/22 [phone]

## 2022-12-05 NOTE — ED Triage Notes (Signed)
Pt reports sore throat x 3-4 days ago, fever 103 today. States throat is just a mild discomfort at this point. Kids have been sick, unsure what they have.   Pt expresses concern with brittle nails, exhaustion. Reports having anemia but is not taking anything for it.   Pt also reports her mouth "chattering" (lower jaw shaking) multiple times per day, typically when she is overstimulated.

## 2022-12-11 ENCOUNTER — Ambulatory Visit (INDEPENDENT_AMBULATORY_CARE_PROVIDER_SITE_OTHER): Payer: MEDICAID | Admitting: Family Medicine

## 2022-12-11 VITALS — BP 129/77 | HR 95 | Temp 97.8°F | Ht 64.57 in | Wt 186.8 lb

## 2022-12-11 DIAGNOSIS — R251 Tremor, unspecified: Secondary | ICD-10-CM | POA: Insufficient documentation

## 2022-12-11 DIAGNOSIS — R079 Chest pain, unspecified: Secondary | ICD-10-CM

## 2022-12-11 DIAGNOSIS — R55 Syncope and collapse: Secondary | ICD-10-CM | POA: Insufficient documentation

## 2022-12-11 NOTE — Assessment & Plan Note (Addendum)
Acute, stable. Referral placed for neurology.

## 2022-12-11 NOTE — Assessment & Plan Note (Addendum)
Acute, occurred 4 days ago, admits to hitting head. Unable to reach scheduling coordinator pool for patient to obtain STAT MRI. Patient advised to go to ED, admits will go to [facility] ED, ED called and informed. Patient agreed to this.

## 2022-12-11 NOTE — Patient Instructions (Signed)
Recommend [facility] for visit

## 2022-12-11 NOTE — Progress Notes (Signed)
BP 129/77   Pulse 95   Temp 97.8 F (36.6 C) (Oral)   Ht 5' 4.57" (1.64 m)   Wt 186 lb 12.8 oz (84.7 kg)   LMP 12/08/2022 (Exact Date)   SpO2 98%   BMI 31.50 kg/m    Subjective:    Patient ID: Stacey Mccarty, female    DOB: 2000/11/01, 22 y.o.   MRN: [id number]  HPI: Stacey Mccarty is a 22 y.o. female  Chief Complaint  Patient presents with   Dizziness   LIGHTHEADEDNESS Patient had a syncope episode at work, (Monday) 4 days ago states she was told she loss consciousness for 3-4 minutes. Admits to hitting her head on the counter and falling to the floor. She remembers a fogginess before this occurred and remembers waking up but does not remember falling. She also admits to an ongoing issue of tremors of bilateral jaw and hands that started 3 weeks ago. She admits to numbness and tingling of her limbs while sitting for long periods of time. She denies previous falls before this occurrence. Complains also of hot and cold sensitivity. Water intake consist of 32-74 ounces daily.   Duration: 2 months with worsening Description of symptoms: lightheaded Duration of episode: 5-10seconds Dizziness frequency: recurrent daily sometimes in the morning and evening Provoking factors:  Physical exertion Aggravating factors: fast rapid movements Triggered by rolling over in bed: Yes Triggered by bending over: yes Aggravated by head movement: yes Aggravated by exertion, coughing, loud noises: yes with movement Recent head injury: yes Recent or current viral symptoms: Yes History of vasovagal episodes: no Nausea: Yes Vomiting: no Tinnitus: no Hearing loss: no Headache: yes once every 2-3 days; mild throbbing; tylenol helps and it goes away.  Photophobia/phonophobia: yes Unsteady gait: no Postural instability: no Diplopia, dysarthria, dysphagia or weakness: no Related to exertion: yes Pallor: no Diaphoresis: no Dyspnea: no Chest pain: yes tenderness  Relevant past medical, surgical,  family and social history reviewed and updated as indicated. Interim medical history since our last visit reviewed. Allergies and medications reviewed and updated.  Review of Systems  Constitutional:  Negative for chills and fever.  Respiratory: Negative.    Cardiovascular:  Positive for chest pain. Negative for palpitations and leg swelling.  Endocrine: Positive for cold intolerance and heat intolerance.  Neurological:  Positive for tremors, syncope, light-headedness, numbness and headaches. Negative for dizziness, seizures, speech difficulty and weakness.    Per HPI unless specifically indicated above     Objective:    BP 129/77   Pulse 95   Temp 97.8 F (36.6 C) (Oral)   Ht 5' 4.57" (1.64 m)   Wt 186 lb 12.8 oz (84.7 kg)   LMP 12/08/2022 (Exact Date)   SpO2 98%   BMI 31.50 kg/m   Wt Readings from Last 3 Encounters:  12/11/22 186 lb 12.8 oz (84.7 kg)  04/16/22 175 lb 11.2 oz (79.7 kg)  02/11/22 170 lb 12.8 oz (77.5 kg)    Physical Exam Vitals and nursing note reviewed.  Constitutional:      General: She is awake. She is not in acute distress.    Appearance: Normal appearance. She is well-developed and well-groomed. She is obese. She is not ill-appearing.  HENT:     Head: Normocephalic and atraumatic.     Right Ear: Hearing and external ear normal. No drainage.     Left Ear: Hearing and external ear normal. No drainage.     Nose: Nose normal.  Eyes:  General: Lids are normal.        Right eye: No discharge.        Left eye: No discharge.     Conjunctiva/sclera: Conjunctivae normal.     Pupils: Pupils are equal, round, and reactive to light.  Cardiovascular:     Rate and Rhythm: Normal rate and regular rhythm.     Pulses:          Radial pulses are 2+ on the right side and 2+ on the left side.       Posterior tibial pulses are 2+ on the right side and 2+ on the left side.     Heart sounds: Normal heart sounds, S1 normal and S2 normal. No murmur heard.    No  gallop.  Pulmonary:     Effort: Pulmonary effort is normal. No accessory muscle usage or respiratory distress.     Breath sounds: Normal breath sounds.  Musculoskeletal:        General: Normal range of motion.     Cervical back: Full passive range of motion without pain and normal range of motion.     Right lower leg: No edema.     Left lower leg: No edema.  Skin:    General: Skin is warm and dry.     Capillary Refill: Capillary refill takes less than 2 seconds.  Neurological:     Mental Status: She is alert and oriented to person, place, and time.     Cranial Nerves: Cranial nerves 2-12 are intact.     Sensory: Sensation is intact.     Motor: Motor function is intact. No weakness or tremor.     Coordination: Romberg sign positive.     Gait: Gait is intact.  Psychiatric:        Attention and Perception: Attention normal.        Mood and Affect: Mood normal.        Speech: Speech normal.        Behavior: Behavior normal. Behavior is cooperative.        Thought Content: Thought content normal.     Results for orders placed or performed during the hospital encounter of 12/05/22  POC Covid19/Flu A&B Antigen  Result Value Ref Range   Influenza A Antigen, POC Negative Negative   Influenza B Antigen, POC Negative Negative   Covid Antigen, POC Negative Negative  POCT rapid strep A  Result Value Ref Range   Rapid Strep A Screen Negative Negative      Assessment & Plan:   Problem List Items Addressed This Visit     Syncope - Primary    Acute, occurred 4 days ago, admits to hitting head. Unable to reach scheduling coordinator pool for patient to obtain STAT MRI. Patient advised to go to ED, admits will go to [facility] ED, ED called and informed. Patient agreed to this.       Relevant Orders   Ambulatory referral to Neurology   Tremor of hands and face    Acute, stable. Referral placed for neurology.       Relevant Orders   Ambulatory referral to Neurology   Other  Visit Diagnoses     Chest pain in adult       Acute, stable, intermittently. Vitals normal. Not actively occuring. Will order Future EKG and have patient return for this.   Relevant Orders   EKG 12-Lead        Follow up plan: Return in about 2 weeks (  around 12/25/2022) for Follow up.

## 2022-12-12 NOTE — Addendum Note (Signed)
Addended by: Prescott Gum on: 12/12/2022 08:40 AM   Modules accepted: Orders

## 2022-12-18 ENCOUNTER — Ambulatory Visit: Payer: Self-pay

## 2022-12-18 ENCOUNTER — Telehealth: Payer: MEDICAID | Admitting: Nurse Practitioner

## 2022-12-18 NOTE — Telephone Encounter (Signed)
Chief Complaint: Syncope  Symptoms: Fainting, headaches, fatigue Frequency: 2 syncope episodes in 1 week Pertinent Negatives: Patient denies injury, weakness, GI symptoms Disposition: [] ED /[] Urgent Care (no appt availability in office) / [x] Appointment(In office/virtual)/ []  [facility]/ [] Home Care/ [] Refused Recommended Disposition /[] [facility]/ []  Follow-up with PCP Additional Notes: Patient stated she passed out at work for about 8 seconds on 12/08/22 and went to the ED. Patient stated she passed out again yesterday in the car with her husband and it lasted for about 11 seconds. Patient denies any body movement per reports of others due each episode, she said they told her she looked like she was sleeping. Patient states she does not know what the cause is and she has had headaches and fatigue recently no matter how much sleep she gets. Care advice was given and patient has been scheduled for a MyChart video visit with PCP today due to patient not being able to drive, she is unsure when the symptoms will occur and does not have anybody available to drive her to a face to face appointment.  Reason for Disposition  [1] All other patients AND [2] now alert and feels fine  (Exception: SIMPLE FAINT due to stress, pain, prolonged standing, or suddenly standing)  Answer Assessment - Initial Assessment Questions 1. ONSET: "How long were you unconscious?" (minutes) "When did it happen?"     About 10-12 seconds, Yesterday 2. CONTENT: "What happened during period of unconsciousness?" (e.g., seizure activity)      Nothing, I looked like I was asleep per husband report  3. MENTAL STATUS: "Alert and oriented now?" (oriented x 3 = name, month, location)      A & O x3 4. TRIGGER: "What do you think caused the fainting?" "What were you doing just before you fainted?"  (e.g., exercise, sudden standing up, prolonged standing)     I'm not sure, I was sitting in the car for about   5. RECURRENT SYMPTOM: "Have you ever passed out before?" If Yes, ask: "When was the last time?" and "What happened that time?"      Yes  12/08/22 at work and I was out for about 8 seconds  6. INJURY: "Did you sustain any injury during the fall?"      No  7. CARDIAC SYMPTOMS: "Have you had any of the following symptoms: chest pain, difficulty breathing, palpitations?"     No  8. NEUROLOGIC SYMPTOMS: "Have you had any of the following symptoms: headache, numbness, vertigo, weakness?"     Headache and numbness  9. GI SYMPTOMS: "Have you had any of the following symptoms: abdomen pain, vomiting, diarrhea, blood in stools?"     No  10. OTHER SYMPTOMS: "Do you have any other symptoms?"       Tiredness 11. PREGNANCY: "Is there any chance you are pregnant?" "When was your last menstrual period?"       No last menstrual was 2 weeks ago  Protocols used: Fainting-A-AH

## 2022-12-25 ENCOUNTER — Ambulatory Visit: Admission: RE | Admit: 2022-12-25 | Payer: MEDICAID | Source: Ambulatory Visit

## 2023-01-01 ENCOUNTER — Ambulatory Visit: Payer: MEDICAID | Admitting: Family Medicine

## 2023-01-22 ENCOUNTER — Ambulatory Visit: Payer: MEDICAID | Admitting: Nurse Practitioner

## 2023-01-29 ENCOUNTER — Encounter: Payer: Self-pay | Admitting: Neurology

## 2023-01-29 ENCOUNTER — Ambulatory Visit (INDEPENDENT_AMBULATORY_CARE_PROVIDER_SITE_OTHER): Payer: MEDICAID | Admitting: Neurology

## 2023-01-29 VITALS — BP 127/77 | HR 85 | Ht 64.0 in | Wt 183.5 lb

## 2023-01-29 DIAGNOSIS — R55 Syncope and collapse: Secondary | ICD-10-CM

## 2023-01-29 NOTE — Progress Notes (Signed)
[facility]  PATIENT: Stacey Mccarty DOB: 19-Jul-2000  REQUESTING CLINICIAN: Prescott Gum Rhond* HISTORY FROM: Patient  REASON FOR VISIT: Syncope    HISTORICAL  CHIEF COMPLAINT:  Chief Complaint  Patient presents with   New Patient (Initial Visit)    Rm15, alone,  internal referral for Syncope: orthostatic bp completed due to frequent fainting accompanied by dizziness/nausea, tremor of hands and face: chattering jaw (ongoing 2 months)    HISTORY OF PRESENT ILLNESS:  This is a 23 year old woman with no reported past medical history who is presenting after a syncopal episode.  Patient reports in the past 2 months she has been having syncopal episodes, brief lasting less than 10 seconds,, sometimes she will just doze of or stare with unresponsiveness.  On average she will have 1 event per week.  The first event occurred while giving a presentation at work, she fell and hit her head.  She saw her PCP and is pending brain imaging.  She tells me during the past 2 months she has been under a lot of stress, husband lost her job, she has been working 70 to 80 hours/week, she also have 2 small children at home.  There is report of fatigue, lack of sleep, and possible poor hydration.  She denies any abnormal movements with these events, no tongue biting, no urinary incontinence and no injuries.  She does report a family history of epilepsy.    OTHER MEDICAL CONDITIONS: None    REVIEW OF SYSTEMS: Full 14 system review of systems performed and negative with exception of: As noted in the HPI  ALLERGIES: Allergies  Allergen Reactions   Iodine Nausea And Vomiting   Latex Rash    HOME MEDICATIONS: Outpatient Medications Prior to Visit  Medication Sig Dispense Refill   ondansetron (ZOFRAN-ODT) 4 MG disintegrating tablet Take 1 tablet (4 mg total) by mouth every 8 (eight) hours as needed for nausea or vomiting. 20 tablet 0   acetaminophen (TYLENOL) 500 MG tablet Take 2  tablets (1,000 mg total) by mouth every 6 (six) hours as needed for mild pain.     PARAGARD INTRAUTERINE COPPER IU 1 Intra Uterine Device by Intrauterine route once.     No facility-administered medications prior to visit.    PAST MEDICAL HISTORY: Past Medical History:  Diagnosis Date   Anemia    Anxiety    Asthma    as a child-no inhalers   Depression    Obesity    Symptomatic cholelithiasis     PAST SURGICAL HISTORY: Past Surgical History:  Procedure Laterality Date   CHOLECYSTECTOMY     NO PAST SURGERIES      FAMILY HISTORY: Family History  Problem Relation Age of Onset   Bipolar disorder Mother    Schizophrenia Mother    Depression Mother    Crohn's disease Mother    Schizophrenia Father    Other Father    Epilepsy Sister    Autism Sister    Autism Maternal Grandfather     SOCIAL HISTORY: Social History   Socioeconomic History   Marital status: Married    Spouse name: Not on file   Number of children: Not on file   Years of education: Not on file   Highest education level: Not on file  Occupational History   Not on file  Tobacco Use   Smoking status: Never   Smokeless tobacco: Never  Vaping Use   Vaping status: Never Used  Substance and Sexual Activity   Alcohol  use: Yes    Comment: rare   Drug use: Never   Sexual activity: Yes    Birth control/protection: I.U.D.  Other Topics Concern   Not on file  Social History Narrative   Not on file   Social Drivers of Health   Financial Resource Strain: Not on file  Food Insecurity: Not on file  Transportation Needs: Not on file  Physical Activity: Not on file  Stress: Not on file  Social Connections: Not on file  Intimate Partner Violence: Not on file     PHYSICAL EXAM  GENERAL EXAM/CONSTITUTIONAL: Vitals:  Vitals:   01/29/23 1043 01/29/23 1044 01/29/23 1045  BP: (!) 141/85 (!) 147/90 127/77  Pulse: 85 85 85  Weight: 183 lb 8 oz (83.2 kg)    Height: 5\' 4"  (1.626 m)     Body mass index  is 31.5 kg/m. Wt Readings from Last 3 Encounters:  01/29/23 183 lb 8 oz (83.2 kg)  12/11/22 186 lb 12.8 oz (84.7 kg)  04/16/22 175 lb 11.2 oz (79.7 kg)   Patient is in no distress; well developed, nourished and groomed; neck is supple  MUSCULOSKELETAL: Gait, strength, tone, movements noted in Neurologic exam below  NEUROLOGIC: MENTAL STATUS:      No data to display         awake, alert, oriented to person, place and time recent and remote memory intact normal attention and concentration language fluent, comprehension intact, naming intact fund of knowledge appropriate  CRANIAL NERVE:  2nd, 3rd, 4th, 6th - Visual fields full to confrontation, extraocular muscles intact, no nystagmus 5th - facial sensation symmetric 7th - facial strength symmetric 8th - hearing intact 9th - palate elevates symmetrically, uvula midline 11th - shoulder shrug symmetric 12th - tongue protrusion midline  MOTOR:  normal bulk and tone, full strength in the BUE, BLE  SENSORY:  normal and symmetric to light touch  COORDINATION:  finger-nose-finger, fine finger movements normal  REFLEXES:  deep tendon reflexes present and symmetric  GAIT/STATION:  normal  DIAGNOSTIC DATA (LABS, IMAGING, TESTING) - I reviewed patient records, labs, notes, testing and imaging myself where available.  Lab Results  Component Value Date   WBC 7.5 12/23/2021   HGB 13.2 12/23/2021   HCT 39.9 12/23/2021   MCV 88 12/23/2021   PLT 299 12/23/2021      Component Value Date/Time   NA 141 12/23/2021 1612   K 4.2 12/23/2021 1612   CL 104 12/23/2021 1612   CO2 21 12/23/2021 1612   GLUCOSE 83 12/23/2021 1612   GLUCOSE 99 12/05/2021 0602   BUN 15 12/23/2021 1612   CREATININE 0.69 12/23/2021 1612   CALCIUM 10.4 (H) 12/23/2021 1612   PROT 7.5 12/23/2021 1612   ALBUMIN 4.9 12/23/2021 1612   AST 30 12/23/2021 1612   ALT 39 (H) 12/23/2021 1612   ALKPHOS 155 (H) 12/23/2021 1612   BILITOT <0.2 12/23/2021 1612    GFRNONAA >60 12/05/2021 0602   Lab Results  Component Value Date   CHOL 211 (H) 12/23/2021   HDL 54 12/23/2021   LDLCALC 139 (H) 12/23/2021   TRIG 99 12/23/2021   CHOLHDL 3.9 12/23/2021   No results found for: "HGBA1C" No results found for: "VITAMINB12" Lab Results  Component Value Date   TSH 0.881 12/23/2021     ASSESSMENT AND PLAN  23 y.o. year old female with no reported past medical history who is presenting with episodes of fainting, episode of unresponsiveness lasting less than 10 seconds with no  abnormal movements, no injury, no tongue biting or urinary incontinence.  There is also increase stress, tiredness, lack of sleep during this timeframe.  Patient events are more likely syncopal events related to tiredness, possible dehydration and increase stress.  My suspicion for seizure is very low.  She does have an MRI brain scheduled, advised her to complete the test and to call us if any abnormality.  Advised her to continue follow-up with her PCP and return as needed as needed.   1. Syncope, unspecified syncope type      Patient Instructions  Increase fluid intake  Proceed with brain imaging  Continue to follow up with PCP  Return as needed   No orders of the defined types were placed in this encounter.   No orders of the defined types were placed in this encounter.   Return if symptoms worsen or fail to improve.    Windell Norfolk, MD 01/29/2023, 11:14 AM  [facility] [address], [address] [place], [place] [address] [phone]

## 2023-01-29 NOTE — Patient Instructions (Signed)
Increase fluid intake  Proceed with brain imaging  Continue to follow up with PCP  Return as needed

## 2023-02-03 ENCOUNTER — Ambulatory Visit: Payer: MEDICAID | Admitting: Nurse Practitioner

## 2023-02-03 NOTE — Progress Notes (Deleted)
   There were no vitals taken for this visit.   Subjective:    Patient ID: Stacey Mccarty, female    DOB: Aug 05, 2000, 23 y.o.   MRN: [id number]  HPI: Stacey Mccarty is a 23 y.o. female  No chief complaint on file.   Relevant past medical, surgical, family and social history reviewed and updated as indicated. Interim medical history since our last visit reviewed. Allergies and medications reviewed and updated.  Review of Systems  Per HPI unless specifically indicated above     Objective:    There were no vitals taken for this visit.  Wt Readings from Last 3 Encounters:  01/29/23 183 lb 8 oz (83.2 kg)  12/11/22 186 lb 12.8 oz (84.7 kg)  04/16/22 175 lb 11.2 oz (79.7 kg)    Physical Exam  Results for orders placed or performed during the hospital encounter of 12/05/22  POCT rapid strep A   Collection Time: 12/05/22  2:41 PM  Result Value Ref Range   Rapid Strep A Screen Negative Negative  POC Covid19/Flu A&B Antigen   Collection Time: 12/05/22  3:04 PM  Result Value Ref Range   Influenza A Antigen, POC Negative Negative   Influenza B Antigen, POC Negative Negative   Covid Antigen, POC Negative Negative      Assessment & Plan:   Problem List Items Addressed This Visit   None    Follow up plan: No follow-ups on file.

## 2023-02-04 ENCOUNTER — Ambulatory Visit: Payer: MEDICAID | Admitting: Neurology

## 2023-03-12 ENCOUNTER — Ambulatory Visit: Payer: MEDICAID | Admitting: Neurology

## 2023-06-26 IMAGING — DX DG FOOT COMPLETE 3+V*L*
3 series · 3 of 3 positions shown · non-contrast
Comparison: None.

CLINICAL DATA: Left foot pain

EXAM:
LEFT FOOT - COMPLETE 3+ VIEW

[foot ap]
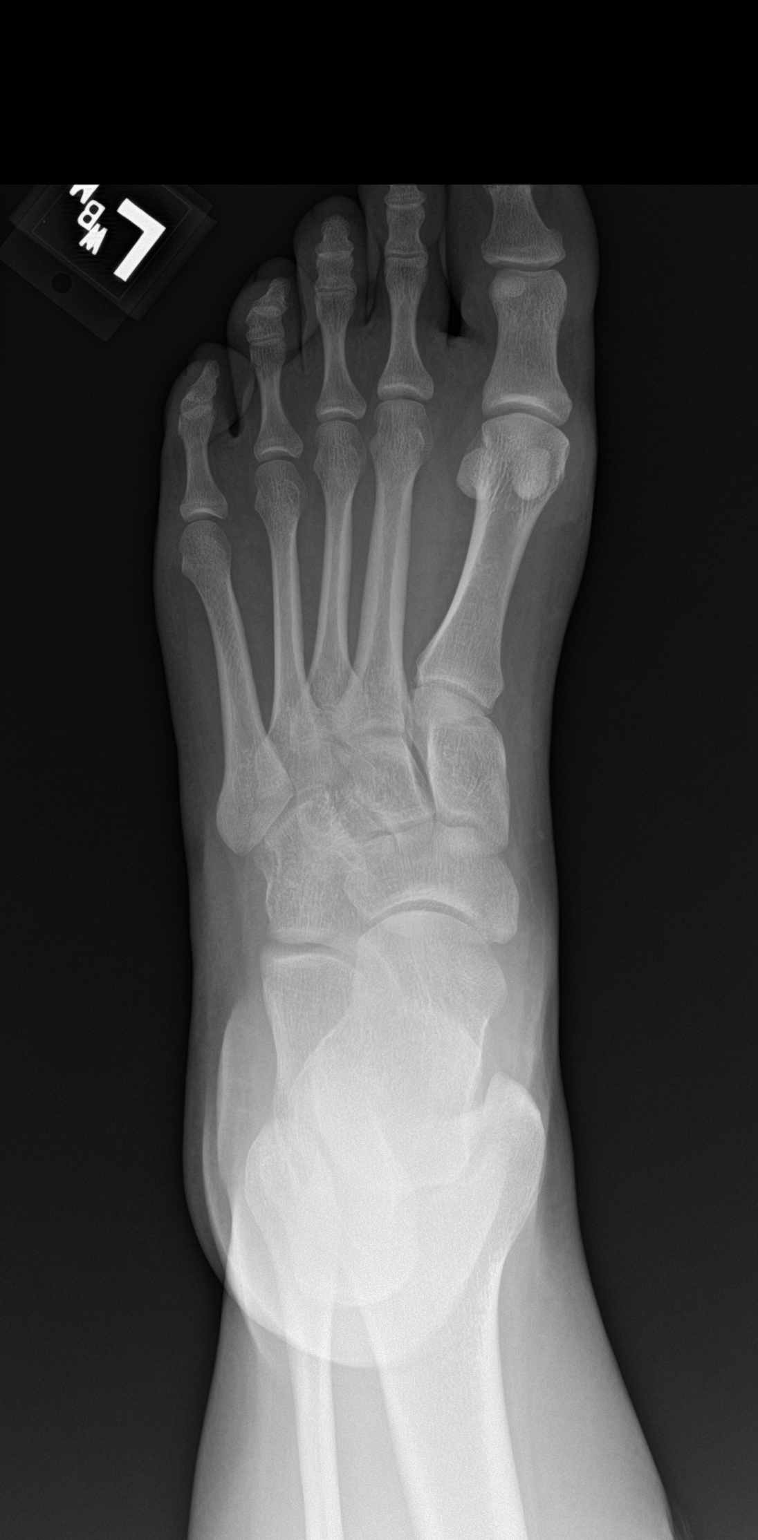

[foot obl]
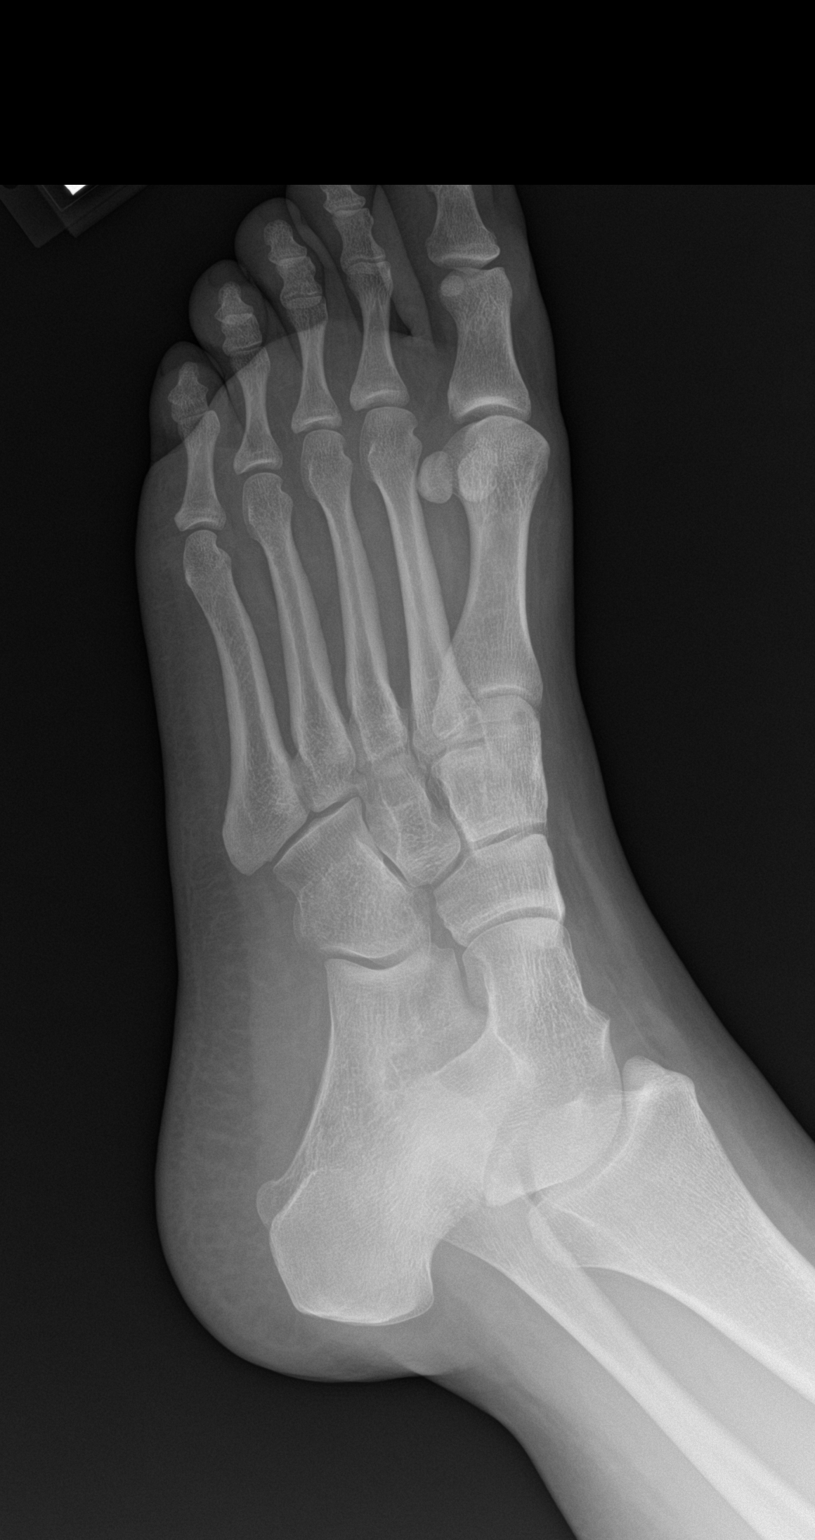

[foot lat]
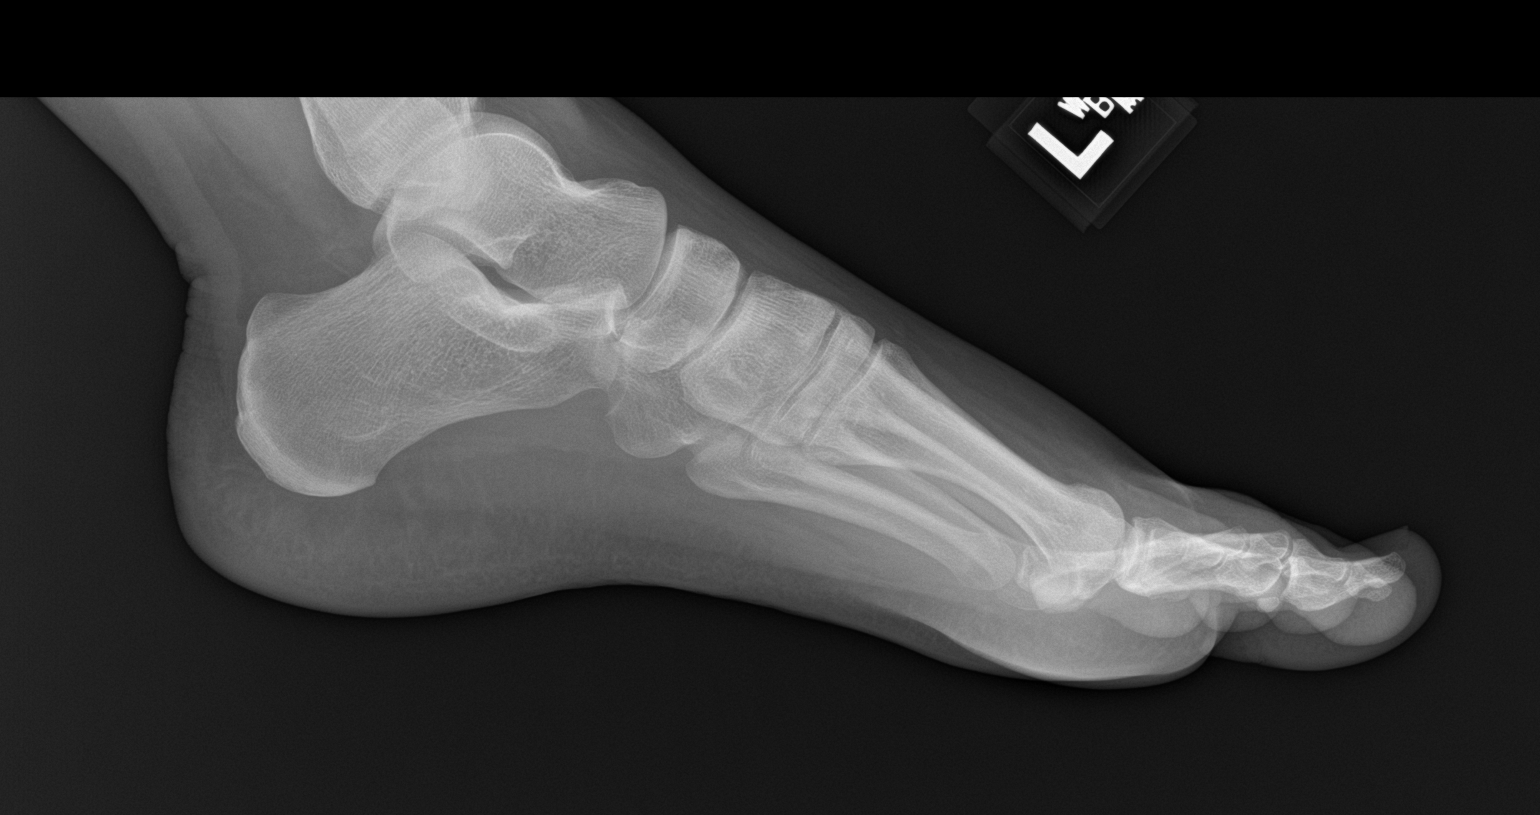

[3 of 3 positions shown; findings below may reference images not displayed]

FINDINGS: There is no evidence of fracture or dislocation. There is no
evidence of arthropathy or other focal bone abnormality. Soft
tissues are unremarkable.
IMPRESSION: Negative.
